# Patient Record
Sex: Female | Born: 1935 | Race: White | Hispanic: No | Smoking: Never smoker
Health system: Southern US, Community
[De-identification: ages and names within clinical notes are randomized; demographics above are authoritative.]

## PROBLEM LIST (undated history)

## (undated) DIAGNOSIS — I2510 Atherosclerotic heart disease of native coronary artery without angina pectoris: Secondary | ICD-10-CM

## (undated) DIAGNOSIS — J45909 Unspecified asthma, uncomplicated: Secondary | ICD-10-CM

## (undated) DIAGNOSIS — K219 Gastro-esophageal reflux disease without esophagitis: Secondary | ICD-10-CM

## (undated) DIAGNOSIS — E119 Type 2 diabetes mellitus without complications: Secondary | ICD-10-CM

## (undated) DIAGNOSIS — M1990 Unspecified osteoarthritis, unspecified site: Secondary | ICD-10-CM

## (undated) HISTORY — PX: APPENDECTOMY: SHX54

## (undated) HISTORY — PX: CHOLECYSTECTOMY: SHX55

## (undated) HISTORY — PX: JOINT REPLACEMENT: SHX530

## (undated) HISTORY — DX: Unspecified osteoarthritis, unspecified site: M19.90

## (undated) HISTORY — PX: ABDOMINAL HYSTERECTOMY: SHX81

## (undated) HISTORY — DX: Gastro-esophageal reflux disease without esophagitis: K21.9

## (undated) HISTORY — PX: BREAST CYST ASPIRATION: SHX578

## (undated) HISTORY — DX: Unspecified asthma, uncomplicated: J45.909

## (undated) HISTORY — DX: Atherosclerotic heart disease of native coronary artery without angina pectoris: I25.10

## (undated) HISTORY — PX: FRACTURE SURGERY: SHX138

---

## 1998-08-10 ENCOUNTER — Encounter: Admission: RE | Admit: 1998-08-10 | Discharge: 1998-11-08 | Payer: Self-pay | Admitting: *Deleted

## 2004-07-11 ENCOUNTER — Ambulatory Visit: Payer: Self-pay

## 2004-07-18 ENCOUNTER — Ambulatory Visit: Payer: Self-pay

## 2004-08-02 ENCOUNTER — Ambulatory Visit: Payer: Self-pay | Admitting: Otolaryngology

## 2005-01-10 ENCOUNTER — Ambulatory Visit: Payer: Self-pay | Admitting: Internal Medicine

## 2005-08-01 ENCOUNTER — Ambulatory Visit: Payer: Self-pay | Admitting: Ophthalmology

## 2005-08-06 ENCOUNTER — Ambulatory Visit: Payer: Self-pay | Admitting: Ophthalmology

## 2005-12-31 ENCOUNTER — Ambulatory Visit: Payer: Self-pay | Admitting: Internal Medicine

## 2006-01-14 ENCOUNTER — Ambulatory Visit: Payer: Self-pay | Admitting: Internal Medicine

## 2006-01-24 ENCOUNTER — Other Ambulatory Visit: Payer: Self-pay

## 2006-01-24 ENCOUNTER — Inpatient Hospital Stay: Payer: Self-pay | Admitting: Cardiology

## 2006-01-28 ENCOUNTER — Ambulatory Visit: Payer: Self-pay | Admitting: Ophthalmology

## 2006-07-29 ENCOUNTER — Ambulatory Visit: Payer: Self-pay | Admitting: Ophthalmology

## 2006-08-05 ENCOUNTER — Ambulatory Visit: Payer: Self-pay | Admitting: Ophthalmology

## 2006-12-16 ENCOUNTER — Ambulatory Visit: Payer: Self-pay | Admitting: Internal Medicine

## 2007-01-16 ENCOUNTER — Ambulatory Visit: Payer: Self-pay | Admitting: Internal Medicine

## 2008-01-18 ENCOUNTER — Ambulatory Visit: Payer: Self-pay | Admitting: Internal Medicine

## 2009-02-13 ENCOUNTER — Ambulatory Visit: Payer: Self-pay | Admitting: Internal Medicine

## 2010-02-15 ENCOUNTER — Ambulatory Visit: Payer: Self-pay | Admitting: Internal Medicine

## 2011-02-19 ENCOUNTER — Ambulatory Visit: Payer: Self-pay | Admitting: Internal Medicine

## 2012-02-20 ENCOUNTER — Ambulatory Visit: Payer: Self-pay

## 2013-02-23 ENCOUNTER — Ambulatory Visit: Payer: Self-pay

## 2014-06-01 ENCOUNTER — Ambulatory Visit: Payer: Self-pay | Admitting: Unknown Physician Specialty

## 2014-09-08 ENCOUNTER — Ambulatory Visit
Admit: 2014-09-08 | Disposition: A | Discharge status: Home / Self Care | Payer: Self-pay | Attending: Infectious Diseases | Admitting: Infectious Diseases

## 2015-02-27 ENCOUNTER — Encounter: Payer: Medicare Other | Attending: Internal Medicine | Admitting: Dietician

## 2015-02-27 ENCOUNTER — Encounter: Payer: Self-pay | Admitting: Dietician

## 2015-02-27 VITALS — BP 142/77 | Ht 66.0 in | Wt 141.9 lb

## 2015-02-27 DIAGNOSIS — E109 Type 1 diabetes mellitus without complications: Secondary | ICD-10-CM | POA: Diagnosis not present

## 2015-02-27 NOTE — Progress Notes (Signed)
Diabetes Self-Management Education  Visit Type: First/Initial  Appt. Start Time: 1330 Appt. End Time: 1430  02/27/2015  Ms. Lindsey Padilla, identified by name and date of birth, is a 81 y.o. female with a diagnosis of Diabetes: Type 1.   ASSESSMENT  Blood pressure 142/77, height  (1.676 m), weight 141 lb 14.4 oz (64.365 kg). Body mass index is 22.91 kg/(m^2).  Pt reports FBG today was 62-ate breakfast and took meal bolus and BG 283 (3 hr pp) and pt took correction bolus prior to coming to appt.  Pt tests BG's ac, bedtime and some 2-3  Am. Pt does not test anymore due to Medicare will only allow her to enough Strips to test 5-6x/day. Pt wears necklace with medical alert.  Pt. reports having much hip pain due to arthritis (left >right) and plans to have a hip replacement in 05-2015. Recent A1C was 8.1% and MD wants A1C <8% prior to surgery. Pt wants A1C <7.5% prior to surgery. Pt reports having no symptoms with  low BG's and MD wants her BG's to run slightly higher due to hypoglycemic unawareness.   Pt wears a Medtronic 523 insulin pump with Novolog insulin. She uses Silhouette insertion sites and changes them every 3 days (rotates sites on  thighs and abdomen). Pt counts carb grams to determine meal boluses. Her current basal rates are:  12am=0.350, 4am=0.450, 7am=0.750, 2pm=0.5, 6pm=0.475, 9:30 pm=0.3; ICR 12am=10, 5p=8; ISF 12am=50, 5pm=40, 8pm=40; target BG's 12am=120-150, 7am=110-140, 8:30pm=120-150; active insulin time 6 hrs. Pt uses Wizard feature for  boluses.       Diabetes Self-Management Education - 02/27/15 1819    Visit Information   Visit Type First/Initial   Initial Visit   Diabetes Type Type 1   Health Coping   How would you rate your overall health? Good   Psychosocial Assessment   Patient Belief/Attitude about Diabetes Motivated to manage diabetes   Self-care barriers None   Patient Concerns Healthy Lifestyle;Glycemic Control  prevent complications   Special Needs  None   Preferred Learning Style Auditory;Visual   Learning Readiness Ready   Complications   Last HgB A1C per patient/outside source 8.1 %   How often do you check your blood sugar? > 4 times/day   Fasting Blood glucose range (mg/dL) 16'X-096'E   Have you had a dilated eye exam in the past 12 months? Yes   Have you had a dental exam in the past 12 months? --  has upper and lower dentures   Are you checking your feet? Yes   How many days per week are you checking your feet? 7   Dietary Intake   Breakfast --  eats 3 meals/day   Snack (afternoon) --  eats occasional snack at 3-4p=fruit or popcorn/nuts or sugar free ice cream bar   Snack (evening) --  eats bedtime snack-brealfast bar or crackers with pnut butter   Exercise   Exercise Type --  exercise is limited due to hip pain from arthritis (pt needs left hip replacement)   Patient Education   Previous Diabetes Education Yes (please comment)   Nutrition management  Carbohydrate counting   Medications Reviewed patients medication for diabetes, action, purpose, timing of dose and side effects.;Taught/reviewed insulin injection, site rotation, insulin storage and needle disposal.  pt wears Medtronic 523 insulin pump with Novolog insulin and Silhouette insertion sites; discussed upgrading her pump which is now 5 hears old.; pt uses thighs and abdomen for pump sites   Monitoring Purpose and frequency of  SMBG.;Identified appropriate SMBG and/or A1C goals.  pt wants A1C below 7.5% prior to hip surgery which is planned for 05-2015; MD noted wants A1C <8%   Acute complications Taught treatment of hypoglycemia - the 15 rule.  pt reports no longer having symptoms with low BG's   Reviewed use of Wizard pump feature for bolusing. Reviewed changing reservoir and pump insertion sites. Explained meaning of active insulin time. Pt requested to lower active time-states "it seems like it stays active for too long". Instructed pt on making changes in Wizard  setup and pt changed active insulin times to 5 hours. Also pt made changes to BG target as follows:  12am=120-140, 7am=110-130; 8:30pm=120-140. Instructed pt on testing basal rates-to start with night time first. Pt has had pump for 5 years and pump is out of warranty. Encouraged pt to consider upgrading pump.     Individualized Plan for Diabetes Self-Management Training:   Learning Objective:  Patient will have a greater understanding of diabetes self-management. Patient education plan is to attend individual and/or group sessions per assessed needs and concerns.   Plan:   Patient Instructions    Check blood sugars 4 x day before each meal and before bed every day, at 2-3 AM and some 2 hr pp  Complete 3 Day Food Record and bring to next appt-estimate carb grams  Bring blood sugar records to the next appointment/class  Call your doctor for  prescription for test strips to test 8x/day  Carry fast acting glucose and a snack at all times  Rotate injection sites  Test night time basal rates when BG is 120 or above at bedtime-DO NOT eat a bedtime snack when testing  Call insurance to check on pump upgrade  Call [employer] [facility] if questions or problems arise-[phone]  Return for F/U appointment on:  03-15-15 with dietitian  Expected Outcomes:     Education material provided: instructions for testing basal rates on pump  If problems or questions, patient to contact team via: [phone]  Future DSME appointment:  03-15-15

## 2015-02-27 NOTE — Patient Instructions (Signed)
   Check blood sugars 4 x day before each meal and before bed every day, at 2-3 AM and some 2 hr pp  Complete 3 Day Food Record and bring to next appt-estimate carb grams  Bring blood sugar records to the next appointment/class  Call your doctor for  prescription for test strips to test 8x/day  Carry fast acting glucose and a snack at all times  Rotate injection sites  Test night time basal rates when BG is 120 or above at bedtime-DO NOT eat a bedtime snack when testing  Call insurance to check on pump upgrade  Call [facility] if questions or problems arise-[phone]  Return for F/U appointment on:  03-15-15

## 2015-03-06 ENCOUNTER — Telehealth: Payer: Self-pay | Admitting: Dietician

## 2015-03-06 NOTE — Telephone Encounter (Signed)
Called pt for update on BG's-pt reports BG's overall are better. Today's FBG 126 and 2 hr pp BG 123. Had FBG 200x1 and low BGx1 50 at night. Pt has changed ICR at supper to 1:10 to prevent low BG's. Unable to check night time basal rates due to bedtime BG's 124, 119, 115. Usually eats bedtime snack and boluses. Advised pt to only bolus for bedtime snack if BG >120. Pt reports she fell on 05-01-15 and has 2 black eyes as result of hitting head-was evaluated in ER. Asked pt to test night time basal rates when able.

## 2015-03-15 ENCOUNTER — Encounter: Payer: Medicare Other | Admitting: Dietician

## 2015-03-15 VITALS — Ht 66.0 in | Wt 142.9 lb

## 2015-03-15 DIAGNOSIS — E109 Type 1 diabetes mellitus without complications: Secondary | ICD-10-CM | POA: Diagnosis not present

## 2015-03-15 NOTE — Progress Notes (Signed)
Diabetes Self-Management Education  Visit Type:  Follow-up  Appt. Start Time: 1030 Appt. End Time: 1130  03/15/2015  Ms. Lindsey Padilla, identified by name and date of birth, is a 81 y.o. female with a diagnosis of Diabetes:  .   ASSESSMENT  Height 5\' 6"  (1.676 m), weight 142 lb 14.4 oz (64.819 kg). Body mass index is 23.08 kg/(m^2).       Diabetes Self-Management Education - 03/15/15 1050    Complications   Fasting Blood glucose range (mg/dL) 409-811;[id number];180-200   Postprandial Blood glucose range (mg/dL) 95-621;[id number];130-179   Have you had a dilated eye exam in the past 12 months? Yes   Have you had a dental exam in the past 12 months? No   Are you checking your feet? Yes   How many days per week are you checking your feet? 7   Exercise   Exercise Type ADL's  uses cane, hip replacement pending in January   Patient Education   Nutrition management  Carbohydrate counting;Other (comment)  importance of protein sources with all meals   Monitoring Taught/discussed recording of test results and interpretation of SMBG.      Learning Objective:  Patient will have a greater understanding of diabetes self-management. Patient education plan is to have RN review BGs and pump settings, communicate with patient by phone, and schedule additional RN or RD visits as needed.   Plan:   Patient Instructions   2400mg  potassium daily is a low postassium diet. When you check labels, aim for 600mg  each meal average.   Make sure to eat a protein food with every meal. Can add an egg or 1/4 cup nuts or 1oz lowfat cheese to your breakfast. If you have a vegetable meal, make one of your vegetables some beans.   Keep carbohydrate intake to 30-45g with each meal.  You can subtract fiber grams, and 1/2 of any sugar alcohols in the food from the total carbohydrate for an accurate carb count.     Expected Outcomes:     Education material provided: Carbohydrate counting (Apidra); Carb Counting and Meal  Planning (Novo)  If problems or questions, patient to contact team via:  Phone  Future DSME appointment: -  To schedule with RN.

## 2015-03-15 NOTE — Patient Instructions (Addendum)
   2400mg  potassium daily is a low postassium diet. When you check labels, aim for 600mg  each meal average.   Make sure to eat a protein food with every meal. Can add an egg or 1/4 cup nuts or 1oz lowfat cheese to your breakfast. If you have a vegetable meal, make one of your vegetables some beans.   Keep carbohydrate intake to 30-45g with each meal.  You can subtract fiber grams, and 1/2 of any sugar alcohols in the food from the total carbohydrate for an accurate carb count.

## 2015-03-21 ENCOUNTER — Telehealth: Payer: Self-pay | Admitting: Dietician

## 2015-03-21 NOTE — Telephone Encounter (Signed)
Called pt who reports saw Dr. Rosalita ChessmanSpratt on 03-13-15=A1C 7.7%-MD decreased 7am basal rate to 0.725 due to recent ac lunch BG's mostly 90's. Pt reports she is getting a new insulin pump. FBG's 90-163;  BG's pp breakfast mostly 200's since basal change was made; ac lunch 87-253; pp lunch 94-193; ac supper 102-135 (57x1); pp supper 65-224; bedtime 122-184; 3AM 91-249. Pt boluses for meals using Wizard feature.  When BG ac is low-pt corrects low BG and may not always be bolusing for meal. Advised pt to correct low BG and use Wizard for meal bolus. Pt changed 12AM  ICR to 9, 11am=10;  asked pt to test night time basal rate when bedtime BG's between 120-150-do NOT eat or take bolus and then check 3am BG and FBG. Pt to call me 03-27-15 with update. Will schedule FU appt for training on new pump when arrives. Will consider CGM if needed.

## 2015-03-27 ENCOUNTER — Telehealth: Payer: Self-pay | Admitting: Dietician

## 2015-03-27 NOTE — Telephone Encounter (Signed)
Called pt for update on BG's-no answer-left message on answering machine for pt to call me

## 2015-04-03 ENCOUNTER — Telehealth: Payer: Self-pay | Admitting: Dietician

## 2015-04-03 NOTE — Telephone Encounter (Signed)
Called pt-pt reports she will not pursue getting a new insulin pump at this time-plans to wait until after her hip surgery in early 2017

## 2015-04-03 NOTE — Telephone Encounter (Signed)
Pt called with BG's-BG's initially improving after 03-22-15 with changes made in ICR 12am 8 - pt is checking BG's 7-8+x/day; Pt went on beach trip 03-27-15 through 03-28-15 and had some 200's then after back home developed sore throat and cold. Since 03-29-15,  FBG's 118-81, 2 hr pp breakfast 192-234, ac lunch 137-277, 2 hr pp lunch 186-212, ac supper 75-180, 2 hr pp supper 135-207, bedtime 96-338, BG's during night 78-369. Sore throat/cold is much improved now. Pt wants to change ICR -assisted over phone to change ICR 12am to 8; 11am ICR was already set to 8. Pt to call me in 3-4 days with BG's or sooner if has low BG's.

## 2015-04-10 ENCOUNTER — Telehealth: Payer: Self-pay | Admitting: Dietician

## 2015-04-10 NOTE — Telephone Encounter (Signed)
Called pt for update on BG's-pt having elevated 3am BG's (193-325; today 143); FBG's 106-329; BG's pp  breakfast 86-217; ac lunch 98, 70, 78, 90, 58, 153, 171; BG's pp lunch 66-186; ac supper 50-184; 2 hr pp supper 108-186; bedtime 113, 140, 252, 57/137, 144, 99, 131. Reviewed pump settings and pt made following changes: basal rates increased 12am=0.375, 4am=0.450, 7am=0.725, 2pm=0.5, 6pm=0.475, increased 10:30pm=0.325; ICR 12am=8, 11am=9, 5pm=8. Asked pt to call me in 1 week with BG's or sooner if problems arise

## 2015-04-17 ENCOUNTER — Telehealth: Payer: Self-pay | Admitting: Dietician

## 2015-04-17 NOTE — Telephone Encounter (Signed)
Called pt for update on BG's-pt reports BG's slightly improved-7 day average 151, 14 day average 147, 30 day average 160-has occasional 60's-80's after lunch and has occasional 200's especially after treating a low BG. Discussed proper treatment for low BG's-advised if BG 60's-70's only take 1-2 glucose tablets and eat only  10 carb gram snack. If severe low BG treat with 3-4 glucose tabs. and a snack. Still having mostly elevated 3am BG's-131, 298, 163, 203, 166, 250.  Pt increased basal rate at 12 am to 0.400. Will follow up in 1 week or sooner if problems.

## 2015-04-24 ENCOUNTER — Telehealth: Payer: Self-pay | Admitting: Dietician

## 2015-04-24 NOTE — Telephone Encounter (Signed)
Pt called on 04-22-15 with concerns about low BG's-had episode with low BG last night and "couldn't think" -BG's were 124, 102, 71, 71, 58, 75 after supper. Reviewed BG log.   Pt was treating low BG with only 1 glucose tablet and a snack and was unable to get BG to come up.  Reviewed proper treatment of low BG's. pp BG's after supper at times have been 110's- pt changed ICR at 5pm to 9 and lowered 6pm basal rate to 0.450. Called pt today for update of BG's. BG's are improved with no more low BG's past 2 days. Occasional elevated pp BG likely due to diet. BG's ac supper past 2 days have been 90's-pt lowered basal rate at 2pm to 0.475. To call me if problems or in 1 week.

## 2015-05-02 ENCOUNTER — Telehealth: Payer: Self-pay | Admitting: Dietician

## 2015-05-02 NOTE — Telephone Encounter (Signed)
Called pt for update on BG's-Pt reports had preop labs on 05-01-15. Hip surgery planned for 05-29-15. Has appointment with Dr Rosalita ChessmanSpratt on 05-23-15. Overall BG's doing somewhat better-no severe low BG's.  3am BG's= 170, 190, 137, 172, 177, 214, 169, 260; FBG 137, 123, 132, 101, 115, 92, 186, 171; 2 hr pp breakfast 189, 78, 193, 80, 102, 241, 76, 202; ac lunch 99, 77, 70, 107, 87, 162, 102, 120; 2 hr pp lunch 102, 185, 123, 79, 136, 84; ac supper 99, 168, 79, 96, 76, 98, 109; 2 hr pp supper 178, 156, 121, 92, 130, 116; bedtime 179, 259, 68, 123, 120, 207, 213. Pt changed 12am basal rate to 0.425 and 2pm basal rate to 0.450. Will FU with pt in 1 wk.

## 2015-05-08 ENCOUNTER — Telehealth: Payer: Self-pay | Admitting: Dietician

## 2015-05-08 NOTE — Telephone Encounter (Signed)
Called pt for update on BG's-3AM BG's vary 114-266; FBG's 133, 123, 133, 157, 173, 73; 2 hr pp breakfast  173, 187, 170, 173, 227, 98; ac lunch 137, 104, 112, 120, 96, 99; 2 hr pp lunch 86, 123, 95, 162, 311 (ate out and unsure if took bolus); ac supper 115, 65, 87, 72, 103, 72; 2 hr pp supper 257, 252, 178, 97, 149, 127; bedtime 154, 152, 179, 183, 102, 146. Pt decreased 2PM basal rate to 0.425. Pt reports hip surgery is set for 05-29-15 and had A1C last wk for preop and results were 7.4%. Will FU with pt in 1 wk.

## 2015-05-16 ENCOUNTER — Telehealth: Payer: Self-pay | Admitting: Dietician

## 2015-05-16 NOTE — Telephone Encounter (Signed)
Called pt-no answer-left message for pt to call me with update on BG's  

## 2015-05-21 HISTORY — PX: JOINT REPLACEMENT: SHX530

## 2015-05-23 ENCOUNTER — Encounter: Payer: Self-pay | Admitting: Dietician

## 2015-05-23 NOTE — Progress Notes (Signed)
Overall improvement in BG's . Recent A1C 7.4% per pt. Faxed update with BG's to Dr. Rosalita ChessmanSpratt. Pt does not wish to return since Medicare only covers 2 hours of education per year. Pt has appt today with Dr. Rosalita ChessmanSpratt at 3pm. Plans hip surgery on 05-29-15.

## 2015-06-01 ENCOUNTER — Encounter
Admission: RE | Admit: 2015-06-01 | Discharge: 2015-06-01 | Disposition: A | Discharge status: Home / Self Care | Payer: Medicare Other | Source: Ambulatory Visit | Attending: Internal Medicine | Admitting: Internal Medicine

## 2015-06-01 DIAGNOSIS — E119 Type 2 diabetes mellitus without complications: Secondary | ICD-10-CM | POA: Insufficient documentation

## 2015-06-01 DIAGNOSIS — Z794 Long term (current) use of insulin: Secondary | ICD-10-CM | POA: Insufficient documentation

## 2015-06-01 DIAGNOSIS — R410 Disorientation, unspecified: Secondary | ICD-10-CM | POA: Insufficient documentation

## 2015-06-02 DIAGNOSIS — Z794 Long term (current) use of insulin: Secondary | ICD-10-CM | POA: Diagnosis not present

## 2015-06-02 DIAGNOSIS — E119 Type 2 diabetes mellitus without complications: Secondary | ICD-10-CM | POA: Diagnosis present

## 2015-06-02 DIAGNOSIS — R410 Disorientation, unspecified: Secondary | ICD-10-CM | POA: Diagnosis present

## 2015-06-02 LAB — GLUCOSE, CAPILLARY
GLUCOSE-CAPILLARY: 321 mg/dL — AB (ref 65–99)
Glucose-Capillary: 262 mg/dL — ABNORMAL HIGH (ref 65–99)
Glucose-Capillary: 248 mg/dL — ABNORMAL HIGH (ref 65–99)

## 2015-06-03 DIAGNOSIS — R410 Disorientation, unspecified: Secondary | ICD-10-CM | POA: Diagnosis not present

## 2015-06-03 LAB — GLUCOSE, CAPILLARY
Glucose-Capillary: 281 mg/dL — ABNORMAL HIGH (ref 65–99)
Glucose-Capillary: 278 mg/dL — ABNORMAL HIGH (ref 65–99)
Glucose-Capillary: 284 mg/dL — ABNORMAL HIGH (ref 65–99)

## 2015-06-04 LAB — GLUCOSE, CAPILLARY
GLUCOSE-CAPILLARY: 206 mg/dL — AB (ref 65–99)
GLUCOSE-CAPILLARY: 173 mg/dL — AB (ref 65–99)
GLUCOSE-CAPILLARY: 238 mg/dL — AB (ref 65–99)
GLUCOSE-CAPILLARY: 186 mg/dL — AB (ref 65–99)
GLUCOSE-CAPILLARY: 214 mg/dL — AB (ref 65–99)
Glucose-Capillary: 209 mg/dL — ABNORMAL HIGH (ref 65–99)
Glucose-Capillary: 277 mg/dL — ABNORMAL HIGH (ref 65–99)
Glucose-Capillary: 222 mg/dL — ABNORMAL HIGH (ref 65–99)

## 2015-06-05 DIAGNOSIS — R410 Disorientation, unspecified: Secondary | ICD-10-CM | POA: Diagnosis not present

## 2015-06-05 LAB — GLUCOSE, CAPILLARY: GLUCOSE-CAPILLARY: 326 mg/dL — AB (ref 65–99)

## 2015-06-06 DIAGNOSIS — R410 Disorientation, unspecified: Secondary | ICD-10-CM | POA: Diagnosis not present

## 2015-06-06 LAB — GLUCOSE, CAPILLARY
GLUCOSE-CAPILLARY: 296 mg/dL — AB (ref 65–99)
GLUCOSE-CAPILLARY: 231 mg/dL — AB (ref 65–99)
GLUCOSE-CAPILLARY: 271 mg/dL — AB (ref 65–99)
GLUCOSE-CAPILLARY: 192 mg/dL — AB (ref 65–99)
GLUCOSE-CAPILLARY: 285 mg/dL — AB (ref 65–99)
Glucose-Capillary: 269 mg/dL — ABNORMAL HIGH (ref 65–99)
Glucose-Capillary: 280 mg/dL — ABNORMAL HIGH (ref 65–99)
Glucose-Capillary: 101 mg/dL — ABNORMAL HIGH (ref 65–99)
Glucose-Capillary: 127 mg/dL — ABNORMAL HIGH (ref 65–99)

## 2015-06-06 LAB — URINALYSIS COMPLETE WITH MICROSCOPIC (ARMC ONLY)
BILIRUBIN URINE: NEGATIVE
Bacteria, UA: NONE SEEN
GLUCOSE, UA: 50 mg/dL — AB
HGB URINE DIPSTICK: NEGATIVE
KETONES UR: NEGATIVE mg/dL
LEUKOCYTES UA: NEGATIVE
NITRITE: NEGATIVE
Protein, ur: NEGATIVE mg/dL
RBC / HPF: NONE SEEN RBC/hpf (ref 0–5)
SPECIFIC GRAVITY, URINE: 1.01 (ref 1.005–1.030)
pH: 6 (ref 5.0–8.0)

## 2015-06-07 DIAGNOSIS — R410 Disorientation, unspecified: Secondary | ICD-10-CM | POA: Diagnosis not present

## 2015-06-07 LAB — GLUCOSE, CAPILLARY
GLUCOSE-CAPILLARY: 129 mg/dL — AB (ref 65–99)
GLUCOSE-CAPILLARY: 230 mg/dL — AB (ref 65–99)
GLUCOSE-CAPILLARY: 268 mg/dL — AB (ref 65–99)
Glucose-Capillary: 120 mg/dL — ABNORMAL HIGH (ref 65–99)
Glucose-Capillary: 118 mg/dL — ABNORMAL HIGH (ref 65–99)

## 2015-06-08 DIAGNOSIS — R410 Disorientation, unspecified: Secondary | ICD-10-CM | POA: Diagnosis not present

## 2015-06-08 LAB — URINE CULTURE: Culture: 20000

## 2015-06-08 LAB — GLUCOSE, CAPILLARY: Glucose-Capillary: 146 mg/dL — ABNORMAL HIGH (ref 65–99)

## 2015-06-09 DIAGNOSIS — R410 Disorientation, unspecified: Secondary | ICD-10-CM | POA: Diagnosis not present

## 2015-06-09 LAB — GLUCOSE, CAPILLARY
GLUCOSE-CAPILLARY: 239 mg/dL — AB (ref 65–99)
GLUCOSE-CAPILLARY: 118 mg/dL — AB (ref 65–99)
GLUCOSE-CAPILLARY: 122 mg/dL — AB (ref 65–99)
GLUCOSE-CAPILLARY: 181 mg/dL — AB (ref 65–99)
GLUCOSE-CAPILLARY: 220 mg/dL — AB (ref 65–99)
GLUCOSE-CAPILLARY: 164 mg/dL — AB (ref 65–99)
Glucose-Capillary: 216 mg/dL — ABNORMAL HIGH (ref 65–99)
Glucose-Capillary: 215 mg/dL — ABNORMAL HIGH (ref 65–99)
Glucose-Capillary: 218 mg/dL — ABNORMAL HIGH (ref 65–99)

## 2015-06-10 DIAGNOSIS — R410 Disorientation, unspecified: Secondary | ICD-10-CM | POA: Diagnosis not present

## 2015-06-10 LAB — GLUCOSE, CAPILLARY
GLUCOSE-CAPILLARY: 142 mg/dL — AB (ref 65–99)
GLUCOSE-CAPILLARY: 223 mg/dL — AB (ref 65–99)
Glucose-Capillary: 173 mg/dL — ABNORMAL HIGH (ref 65–99)

## 2015-06-11 LAB — GLUCOSE, CAPILLARY
GLUCOSE-CAPILLARY: 176 mg/dL — AB (ref 65–99)
Glucose-Capillary: 177 mg/dL — ABNORMAL HIGH (ref 65–99)
Glucose-Capillary: 198 mg/dL — ABNORMAL HIGH (ref 65–99)

## 2015-06-12 DIAGNOSIS — R410 Disorientation, unspecified: Secondary | ICD-10-CM | POA: Diagnosis not present

## 2015-06-12 LAB — GLUCOSE, CAPILLARY
GLUCOSE-CAPILLARY: 236 mg/dL — AB (ref 65–99)
GLUCOSE-CAPILLARY: 233 mg/dL — AB (ref 65–99)
Glucose-Capillary: 158 mg/dL — ABNORMAL HIGH (ref 65–99)
Glucose-Capillary: 222 mg/dL — ABNORMAL HIGH (ref 65–99)
Glucose-Capillary: 215 mg/dL — ABNORMAL HIGH (ref 65–99)

## 2015-06-13 DIAGNOSIS — R410 Disorientation, unspecified: Secondary | ICD-10-CM | POA: Diagnosis not present

## 2015-06-13 LAB — GLUCOSE, CAPILLARY
GLUCOSE-CAPILLARY: 249 mg/dL — AB (ref 65–99)
Glucose-Capillary: 201 mg/dL — ABNORMAL HIGH (ref 65–99)
Glucose-Capillary: 205 mg/dL — ABNORMAL HIGH (ref 65–99)
Glucose-Capillary: 177 mg/dL — ABNORMAL HIGH (ref 65–99)
Glucose-Capillary: 180 mg/dL — ABNORMAL HIGH (ref 65–99)

## 2015-06-14 LAB — GLUCOSE, CAPILLARY
GLUCOSE-CAPILLARY: 175 mg/dL — AB (ref 65–99)
Glucose-Capillary: 238 mg/dL — ABNORMAL HIGH (ref 65–99)
Glucose-Capillary: 184 mg/dL — ABNORMAL HIGH (ref 65–99)
Glucose-Capillary: 189 mg/dL — ABNORMAL HIGH (ref 65–99)
Glucose-Capillary: 215 mg/dL — ABNORMAL HIGH (ref 65–99)
Glucose-Capillary: 190 mg/dL — ABNORMAL HIGH (ref 65–99)
Glucose-Capillary: 83 mg/dL (ref 65–99)

## 2015-06-15 DIAGNOSIS — R410 Disorientation, unspecified: Secondary | ICD-10-CM | POA: Diagnosis not present

## 2015-06-15 LAB — GLUCOSE, CAPILLARY
GLUCOSE-CAPILLARY: 89 mg/dL (ref 65–99)
GLUCOSE-CAPILLARY: 120 mg/dL — AB (ref 65–99)
GLUCOSE-CAPILLARY: 119 mg/dL — AB (ref 65–99)
GLUCOSE-CAPILLARY: 178 mg/dL — AB (ref 65–99)
Glucose-Capillary: 238 mg/dL — ABNORMAL HIGH (ref 65–99)
Glucose-Capillary: 170 mg/dL — ABNORMAL HIGH (ref 65–99)

## 2015-06-16 DIAGNOSIS — R410 Disorientation, unspecified: Secondary | ICD-10-CM | POA: Diagnosis not present

## 2015-06-16 LAB — GLUCOSE, CAPILLARY
GLUCOSE-CAPILLARY: 181 mg/dL — AB (ref 65–99)
GLUCOSE-CAPILLARY: 211 mg/dL — AB (ref 65–99)
Glucose-Capillary: 272 mg/dL — ABNORMAL HIGH (ref 65–99)
Glucose-Capillary: 156 mg/dL — ABNORMAL HIGH (ref 65–99)
Glucose-Capillary: 118 mg/dL — ABNORMAL HIGH (ref 65–99)
Glucose-Capillary: 176 mg/dL — ABNORMAL HIGH (ref 65–99)

## 2015-06-17 DIAGNOSIS — R410 Disorientation, unspecified: Secondary | ICD-10-CM | POA: Diagnosis not present

## 2015-06-17 LAB — GLUCOSE, CAPILLARY
GLUCOSE-CAPILLARY: 155 mg/dL — AB (ref 65–99)
GLUCOSE-CAPILLARY: 207 mg/dL — AB (ref 65–99)
Glucose-Capillary: 191 mg/dL — ABNORMAL HIGH (ref 65–99)
Glucose-Capillary: 201 mg/dL — ABNORMAL HIGH (ref 65–99)
Glucose-Capillary: 312 mg/dL — ABNORMAL HIGH (ref 65–99)

## 2015-06-18 DIAGNOSIS — R410 Disorientation, unspecified: Secondary | ICD-10-CM | POA: Diagnosis not present

## 2015-06-18 LAB — GLUCOSE, CAPILLARY
Glucose-Capillary: 218 mg/dL — ABNORMAL HIGH (ref 65–99)
Glucose-Capillary: 234 mg/dL — ABNORMAL HIGH (ref 65–99)
Glucose-Capillary: 166 mg/dL — ABNORMAL HIGH (ref 65–99)

## 2015-06-19 DIAGNOSIS — R410 Disorientation, unspecified: Secondary | ICD-10-CM | POA: Diagnosis not present

## 2015-06-19 LAB — GLUCOSE, CAPILLARY
GLUCOSE-CAPILLARY: 184 mg/dL — AB (ref 65–99)
GLUCOSE-CAPILLARY: 94 mg/dL (ref 65–99)
GLUCOSE-CAPILLARY: 113 mg/dL — AB (ref 65–99)
GLUCOSE-CAPILLARY: 176 mg/dL — AB (ref 65–99)
Glucose-Capillary: 95 mg/dL (ref 65–99)
Glucose-Capillary: 75 mg/dL (ref 65–99)
Glucose-Capillary: 222 mg/dL — ABNORMAL HIGH (ref 65–99)

## 2015-06-20 DIAGNOSIS — R410 Disorientation, unspecified: Secondary | ICD-10-CM | POA: Diagnosis not present

## 2015-06-20 LAB — GLUCOSE, CAPILLARY
GLUCOSE-CAPILLARY: 292 mg/dL — AB (ref 65–99)
GLUCOSE-CAPILLARY: 165 mg/dL — AB (ref 65–99)
GLUCOSE-CAPILLARY: 135 mg/dL — AB (ref 65–99)
GLUCOSE-CAPILLARY: 117 mg/dL — AB (ref 65–99)
Glucose-Capillary: 78 mg/dL (ref 65–99)

## 2015-06-21 ENCOUNTER — Encounter
Admission: RE | Admit: 2015-06-21 | Discharge: 2015-06-21 | Disposition: A | Discharge status: Home / Self Care | Payer: Medicare Other | Source: Ambulatory Visit | Attending: Internal Medicine | Admitting: Internal Medicine

## 2015-06-21 DIAGNOSIS — E119 Type 2 diabetes mellitus without complications: Secondary | ICD-10-CM | POA: Insufficient documentation

## 2015-06-21 LAB — GLUCOSE, CAPILLARY
GLUCOSE-CAPILLARY: 393 mg/dL — AB (ref 65–99)
GLUCOSE-CAPILLARY: 338 mg/dL — AB (ref 65–99)
Glucose-Capillary: 411 mg/dL — ABNORMAL HIGH (ref 65–99)

## 2015-06-23 ENCOUNTER — Encounter
Admission: RE | Admit: 2015-06-23 | Discharge: 2015-06-23 | Disposition: A | Discharge status: Home / Self Care | Payer: Medicare Other | Source: Ambulatory Visit | Attending: Internal Medicine | Admitting: Internal Medicine

## 2015-09-04 ENCOUNTER — Ambulatory Visit: Payer: Medicare Other | Admitting: Dietician

## 2015-09-05 ENCOUNTER — Encounter: Payer: Self-pay | Admitting: Dietician

## 2015-09-05 ENCOUNTER — Encounter: Payer: Medicare Other | Attending: Internal Medicine | Admitting: Dietician

## 2015-09-05 NOTE — Patient Instructions (Signed)
Use booklet Using the Minimed 630G Insulin Pump for assistance when starting a new reservoir/set and bolusing  Call if questions arise [phone]

## 2015-09-05 NOTE — Progress Notes (Signed)
Pump Upgrade Training: Appt start time: 1300 end time: 1500  Assessment: Primary concerns today: Patient here for upgrade from 523 Minimed pump to Minimed 630G pump  insulin pump. Patient  has not completed training modules from [employer] but did review Using the Minimed 630G Insulin Pump for Experienced Pump Users booklet   Medications: see list. Insulin used in pump is Humalog  Intervention:  Pump settings transferred directly from current pump to new pump by patient New meter ID added to pump for communicating Reviewed new features of pump with patient Patient  is not signed up on [facility] Web Site Patient started new 630G insulin pump with Silhouette insertion set to right abdomen Practiced giving boluses via 630G pump Patient expressed understanding of info taught and is wearing pump by end of visit Instructed on use of [place] insertion set per patient's request   Follow Up   Patient to see MD for insulin dose adjustments  To call if questions arise [phone]

## 2016-05-30 ENCOUNTER — Emergency Department: Payer: Medicare Other

## 2016-05-30 ENCOUNTER — Emergency Department
Admission: EM | Admit: 2016-05-30 | Discharge: 2016-05-30 | Disposition: A | Discharge status: Other Acute Inpatient Hospital | Payer: Medicare Other | Attending: Emergency Medicine | Admitting: Emergency Medicine

## 2016-05-30 ENCOUNTER — Encounter: Payer: Self-pay | Admitting: *Deleted

## 2016-05-30 DIAGNOSIS — S7221XA Displaced subtrochanteric fracture of right femur, initial encounter for closed fracture: Secondary | ICD-10-CM | POA: Insufficient documentation

## 2016-05-30 DIAGNOSIS — Y939 Activity, unspecified: Secondary | ICD-10-CM | POA: Insufficient documentation

## 2016-05-30 DIAGNOSIS — S79911A Unspecified injury of right hip, initial encounter: Secondary | ICD-10-CM | POA: Diagnosis present

## 2016-05-30 DIAGNOSIS — J45909 Unspecified asthma, uncomplicated: Secondary | ICD-10-CM | POA: Diagnosis not present

## 2016-05-30 DIAGNOSIS — Y929 Unspecified place or not applicable: Secondary | ICD-10-CM | POA: Diagnosis not present

## 2016-05-30 DIAGNOSIS — Y999 Unspecified external cause status: Secondary | ICD-10-CM | POA: Diagnosis not present

## 2016-05-30 DIAGNOSIS — W109XXA Fall (on) (from) unspecified stairs and steps, initial encounter: Secondary | ICD-10-CM | POA: Diagnosis not present

## 2016-05-30 DIAGNOSIS — S72001A Fracture of unspecified part of neck of right femur, initial encounter for closed fracture: Secondary | ICD-10-CM

## 2016-05-30 LAB — URINALYSIS, COMPLETE (UACMP) WITH MICROSCOPIC
BACTERIA UA: NONE SEEN
Bilirubin Urine: NEGATIVE
Glucose, UA: NEGATIVE mg/dL
HGB URINE DIPSTICK: NEGATIVE
Ketones, ur: 5 mg/dL — AB
LEUKOCYTES UA: NEGATIVE
NITRITE: NEGATIVE
PH: 5 (ref 5.0–8.0)
Protein, ur: NEGATIVE mg/dL
SPECIFIC GRAVITY, URINE: 1.014 (ref 1.005–1.030)

## 2016-05-30 LAB — COMPREHENSIVE METABOLIC PANEL
ALT: 23 U/L (ref 14–54)
AST: 32 U/L (ref 15–41)
Albumin: 3.8 g/dL (ref 3.5–5.0)
Alkaline Phosphatase: 54 U/L (ref 38–126)
Anion gap: 8 (ref 5–15)
BUN: 27 mg/dL — ABNORMAL HIGH (ref 6–20)
CHLORIDE: 103 mmol/L (ref 101–111)
CO2: 24 mmol/L (ref 22–32)
CREATININE: 0.91 mg/dL (ref 0.44–1.00)
Calcium: 9.4 mg/dL (ref 8.9–10.3)
GFR, EST NON AFRICAN AMERICAN: 57 mL/min — AB (ref >60)
Glucose, Bld: 156 mg/dL — ABNORMAL HIGH (ref 65–99)
POTASSIUM: 4.7 mmol/L (ref 3.5–5.1)
SODIUM: 135 mmol/L (ref 135–145)
Total Bilirubin: 0.4 mg/dL (ref 0.3–1.2)
Total Protein: 6.3 g/dL — ABNORMAL LOW (ref 6.5–8.1)

## 2016-05-30 LAB — CBC WITH DIFFERENTIAL/PLATELET
Basophils Absolute: 0 10*3/uL (ref 0–0.1)
Basophils Relative: 1 %
EOS ABS: 0.2 10*3/uL (ref 0–0.7)
Eosinophils Relative: 3 %
HEMATOCRIT: 33.4 % — AB (ref 35.0–47.0)
HEMOGLOBIN: 11.1 g/dL — AB (ref 12.0–16.0)
LYMPHS ABS: 1.7 10*3/uL (ref 1.0–3.6)
Lymphocytes Relative: 24 %
MCH: 30.9 pg (ref 26.0–34.0)
MCHC: 33.2 g/dL (ref 32.0–36.0)
MCV: 93 fL (ref 80.0–100.0)
MONOS PCT: 9 %
Monocytes Absolute: 0.7 10*3/uL (ref 0.2–0.9)
NEUTROS PCT: 63 %
Neutro Abs: 4.6 10*3/uL (ref 1.4–6.5)
Platelets: 221 10*3/uL (ref 150–440)
RBC: 3.59 MIL/uL — AB (ref 3.80–5.20)
RDW: 13 % (ref 11.5–14.5)
WBC: 7.3 10*3/uL (ref 3.6–11.0)

## 2016-05-30 LAB — TYPE AND SCREEN
ABO/RH(D): A POS
ANTIBODY SCREEN: NEGATIVE

## 2016-05-30 LAB — GLUCOSE, CAPILLARY
GLUCOSE-CAPILLARY: 142 mg/dL — AB (ref 65–99)
GLUCOSE-CAPILLARY: 129 mg/dL — AB (ref 65–99)
GLUCOSE-CAPILLARY: 101 mg/dL — AB (ref 65–99)
Glucose-Capillary: 114 mg/dL — ABNORMAL HIGH (ref 65–99)

## 2016-05-30 LAB — PROTIME-INR
INR: 0.97
PROTHROMBIN TIME: 12.9 s (ref 11.4–15.2)

## 2016-05-30 MED ORDER — MORPHINE SULFATE (PF) 4 MG/ML IV SOLN
4.0000 mg | Freq: Once | INTRAVENOUS | Status: AC
  Administered 2016-05-30: 4 mg via INTRAVENOUS
  Filled 2016-05-30: qty 1

## 2016-05-30 MED ORDER — MORPHINE SULFATE (PF) 2 MG/ML IV SOLN
INTRAVENOUS | Status: AC
  Administered 2016-05-30: 2 mg via INTRAVENOUS
  Filled 2016-05-30: qty 1

## 2016-05-30 MED ORDER — ONDANSETRON HCL 4 MG/2ML IJ SOLN
4.0000 mg | Freq: Once | INTRAMUSCULAR | Status: AC
  Administered 2016-05-30: 4 mg via INTRAVENOUS

## 2016-05-30 MED ORDER — MORPHINE SULFATE (PF) 4 MG/ML IV SOLN
4.0000 mg | Freq: Once | INTRAVENOUS | Status: AC
  Administered 2016-05-30: 4 mg via INTRAVENOUS

## 2016-05-30 MED ORDER — MORPHINE SULFATE (PF) 4 MG/ML IV SOLN
INTRAVENOUS | Status: AC
  Filled 2016-05-30: qty 1

## 2016-05-30 MED ORDER — MORPHINE SULFATE (PF) 2 MG/ML IV SOLN
2.0000 mg | Freq: Once | INTRAVENOUS | Status: AC
  Administered 2016-05-30: 2 mg via INTRAVENOUS

## 2016-05-30 MED ORDER — SODIUM CHLORIDE 0.9 % IV SOLN
Freq: Once | INTRAVENOUS | Status: AC
  Administered 2016-05-30: 15:00:00 via INTRAVENOUS

## 2016-05-30 MED ORDER — MORPHINE SULFATE (PF) 4 MG/ML IV SOLN
6.0000 mg | Freq: Once | INTRAVENOUS | Status: DC
  Filled 2016-05-30: qty 2

## 2016-05-30 MED ORDER — DEXTROSE 5 % IV SOLN
Freq: Once | INTRAVENOUS | Status: AC
  Administered 2016-05-30: 15:00:00 via INTRAVENOUS

## 2016-05-30 MED ORDER — ONDANSETRON HCL 4 MG/2ML IJ SOLN
INTRAMUSCULAR | Status: AC
  Administered 2016-05-30: 4 mg via INTRAVENOUS
  Filled 2016-05-30: qty 2

## 2016-05-30 NOTE — ED Triage Notes (Signed)
Pt tripped going down her steps, no LOC, denies hitting head, pt is on Plavix, pt is alert and oriented, pt has an insulin pump , current blood sugar 197, pt complains of right hip pain,pt unable to straighten leg

## 2016-05-30 NOTE — ED Provider Notes (Signed)
[facility] Emergency Department Provider Note   ____________________________________________   First MD Initiated Contact with Patient 05/30/16 1116     (approximate)  I have reviewed the triage vital signs and the nursing notes.   HISTORY  Chief Complaint Fall    HPI Lindsey Padilla is a 83 y.o. female patient reports she tripped up the stairs and fell hitting her right hip. She denies hitting her head she denies any other injuries she did not pass out. The pain in the right hip and cannot move it. She has no numbness or tingling in the hip. The pain initially was severe but now she says it does not hurt if she doesn't move it. She does not want pain medicine. She and her family reports she had her last hip done at [facility] and they want to go back to [facility] and have it done again because we do not have a diabetic management care team here. She is on an insulin pump. She had to go to [facility] last year for the same reason.  Past Medical History:  Diagnosis Date  . Arthritis   . Asthma   . Coronary artery disease   . GERD (gastroesophageal reflux disease)     There are no active problems to display for this patient.   History reviewed. No pertinent surgical history.  Prior to Admission medications   Medication Sig Start Date End Date Taking? Authorizing Provider  acetaminophen (RA ACETAMINOPHEN) 650 MG CR tablet Take 2 tablets by mouth 2 (two) times daily. As needed for back pain 08/31/09   Historical Provider, MD  Artificial Tear Solution (TEARS NATURALE OP) Apply 1 drop to eye every 4 (four) hours as needed. For dry eye    Historical Provider, MD  Ascorbic Acid (VITAMIN C) 100 MG tablet Take 1 tablet by mouth daily. 02/20/09   Historical Provider, MD  aspirin 81 MG tablet Take 1 tablet by mouth daily. 12/01/09   Historical Provider, MD  Biotin (BIOTIN MAXIMUM STRENGTH) 10 MG TABS Take 1,000 mg by mouth daily.     Historical Provider, MD  budesonide  (PULMICORT FLEXHALER) 180 MCG/ACT inhaler as needed. 05/22/12   Historical Provider, MD  Calcium Carb-Cholecalciferol (CALCIUM-VITAMIN D3) 600-400 MG-UNIT CAPS Take 1 tablet by mouth daily. 02/20/09   Historical Provider, MD  celecoxib (CELEBREX) 200 MG capsule Take 200 mg by mouth daily. 05/23/14   Historical Provider, MD  citalopram (CELEXA) 10 MG tablet Take 1 tablet by mouth daily. 12/01/14 12/01/15  Historical Provider, MD  clopidogrel (PLAVIX) 75 MG tablet Take 75 mg by mouth daily. 05/22/12   Historical Provider, MD  fluticasone (FLONASE) 50 MCG/ACT nasal spray Place into both nostrils as needed for allergies or rhinitis.    Historical Provider, MD  gabapentin (NEURONTIN) 100 MG capsule Take 200 mg by mouth 2 (two) times daily. 1 capsule in AM and 2 capsules in PM    Historical Provider, MD  glucose blood (ONE TOUCH ULTRA TEST) test strip 6 strips daily. Reported on 09/05/2015 12/01/09   Historical Provider, MD  glucose blood (ONE TOUCH ULTRA TEST) test strip Test up to 8 times daily 05/23/15   Historical Provider, MD  insulin glargine (LANTUS) 100 UNIT/ML injection Inject 11 Units into the skin daily. OFF PUMP PLAN 12/01/14   Historical Provider, MD  insulin lispro (HUMALOG) 100 UNIT/ML injection Inject 70 Units into the skin. Up to 70 units in insulin pump 09/04/15   Historical Provider, MD  levothyroxine (SYNTHROID, LEVOTHROID)  112 MCG tablet Take 1 tablet by mouth daily. 12/01/14   Historical Provider, MD  lisinopril (PRINIVIL,ZESTRIL) 10 MG tablet Take 20 mg by mouth daily. 05/22/12   Historical Provider, MD  magnesium 30 MG tablet Take 500 mg by mouth daily.    Historical Provider, MD  metoprolol succinate (TOPROL-XL) 25 MG 24 hr tablet Take 1 tablet by mouth daily. 08/09/15   Historical Provider, MD  montelukast (SINGULAIR) 10 MG tablet Take 1 tablet by mouth daily. 04/24/15 04/23/16  Historical Provider, MD  Multiple Vitamin (MULTIVITAMIN) capsule Take 1 capsule by mouth daily. 02/20/09   Historical  Provider, MD  pantoprazole (PROTONIX) 40 MG tablet Take 1 tablet by mouth daily. 05/05/14   Historical Provider, MD  pravastatin (PRAVACHOL) 20 MG tablet Take 20 mg by mouth daily. 05/21/12   Historical Provider, MD    Allergies Sulfa antibiotics; Atorvastatin; Penicillins; Propoxyphene; Tramadol; and Erythromycin  No family history on file.  Social History Social History  Substance Use Topics  . Smoking status: Never Smoker  . Smokeless tobacco: Not on file  . Alcohol use No    Review of Systems Constitutional: No fever/chills Eyes: No visual changes. ENT: No sore throat. Cardiovascular: Denies chest pain. Respiratory: Denies shortness of breath. Gastrointestinal: No abdominal pain.  No nausea, no vomiting.  No diarrhea.  No constipation. Genitourinary: Negative for dysuria. Musculoskeletal: Negative for back pain. Skin: Negative for rash. Neurological: Negative for headaches, focal weakness or numbness.  10-point ROS otherwise negative.  ____________________________________________   PHYSICAL EXAM:  VITAL SIGNS: ED Triage Vitals [05/30/16 1041]  Enc Vitals Group     BP (!) 163/6     Pulse Rate 81     Resp 20     Temp 97.8 F (36.6 C)     Temp src      SpO2 100 %     Weight 148 lb (67.1 kg)     Height 5\' 6"  (1.676 m)     Head Circumference      Peak Flow      Pain Score 10     Pain Loc      Pain Edu?      Excl. in GC?     Constitutional: Alert and oriented. Well appearing and in no acute distress! Eyes: Conjunctivae are normal. PERRL. EOMI. Head: Atraumatic. Nose: No congestion/rhinnorhea. Mouth/Throat: Mucous membranes are moist.  Oropharynx non-erythematous. Neck: No stridor. No neck tenderness Cardiovascular: Normal rate, regular rhythm. Grossly normal heart sounds.  Good peripheral circulation. Respiratory: Normal respiratory effort.  No retractions. Lungs CTAB. Gastrointestinal: Soft and nontender. No distention. No abdominal bruits. No CVA  tenderness. Musculoskeletal: Left leg is normal right leg is bent and outwardly rotated. Normal distal pulses and sensation. She has a lot of pain on palpation of the right hip. Neurologic:  Normal speech and language. No gross focal neurologic deficits are appreciated. No gait instability. Skin:  Skin is warm, dry and intact. No rash noted. Psychiatric: Mood and affect are normal. Speech and behavior are normal.  ____________________________________________   LABS (all labs ordered are listed, but only abnormal results are displayed)  Labs Reviewed  COMPREHENSIVE METABOLIC PANEL - Abnormal; Notable for the following:       Result Value   Glucose, Bld 156 (*)    BUN 27 (*)    Total Protein 6.3 (*)    GFR calc non Af Amer 57 (*)    All other components within normal limits  CBC WITH DIFFERENTIAL/PLATELET - Abnormal; Notable  for the following:    RBC 3.59 (*)    Hemoglobin 11.1 (*)    HCT 33.4 (*)    All other components within normal limits  GLUCOSE, CAPILLARY - Abnormal; Notable for the following:    Glucose-Capillary 142 (*)    All other components within normal limits  PROTIME-INR  URINALYSIS, COMPLETE (UACMP) WITH MICROSCOPIC  TYPE AND SCREEN   ____________________________________________  EKG  KG shows normal sinus rhythm rate of 81 left axis no acute ST-T wave changes there are waves in V1 which would imply the presence of a flutter but I do not see flutter waves in any other leads. The computer is reading ST elevation inferiorly but that actually his PR segment depression inferiorly ____________________________________________  RADIOLOGY  Study Result   CLINICAL DATA:  RIGHT hip pain post fall at home today, unable to walk or stand, pain with movement, history asthma, GERD, coronary artery disease  EXAM: CHEST 1 VIEW  COMPARISON:  None  FINDINGS: Normal heart size, mediastinal contours, and pulmonary vascularity.  Lungs clear.  No pleural effusion or  pneumothorax.  Bones appear demineralized.  IMPRESSION: No acute abnormalities.   Electronically Signed   By: Ulyses SouthwardMark  Boles M.D.   On: 05/30/2016 11:44   Study Result   CLINICAL DATA:  Pain.  EXAM: DG HIP (WITH OR WITHOUT PELVIS) 2-3V RIGHT  COMPARISON:  05/30/2016 .  FINDINGS: Lumbar spine scoliosis concave right. Subtrochanteric displaced angulated overriding right hip fracture noted. Total left hip replacement .  IMPRESSION: Sub trochanteric displaced, angulated, overriding right hip fracture.   Electronically Signed   By: Maisie Fushomas  Register   On: 05/30/2016 11:45    ____________________________________________   PROCEDURES  Procedure(s) performed:  Procedures  Critical Care performed:   ____________________________________________   INITIAL IMPRESSION / ASSESSMENT AND PLAN / ED COURSE  Pertinent labs & imaging results that were available during my care of the patient were reviewed by me and considered in my medical decision making (see chart for details).    Clinical Course      ____________________________________________   FINAL CLINICAL IMPRESSION(S) / ED DIAGNOSES  Final diagnoses:  Closed fracture of right hip, initial encounter (HCC)      NEW MEDICATIONS STARTED DURING THIS VISIT:  New Prescriptions   No medications on file     Note:  This document was prepared using Dragon voice recognition software and may include unintentional dictation errors.    Arnaldo NatalPaul F Malinda, MD 05/30/16 1246

## 2016-05-31 LAB — GLUCOSE, CAPILLARY: Glucose-Capillary: 116 mg/dL — ABNORMAL HIGH (ref 65–99)

## 2016-07-10 ENCOUNTER — Other Ambulatory Visit: Payer: Self-pay | Admitting: Physician Assistant

## 2016-07-10 DIAGNOSIS — M25561 Pain in right knee: Secondary | ICD-10-CM

## 2016-07-12 ENCOUNTER — Ambulatory Visit
Admission: RE | Admit: 2016-07-12 | Discharge: 2016-07-12 | Disposition: A | Discharge status: Home / Self Care | Payer: Medicare Other | Source: Ambulatory Visit | Attending: Physician Assistant | Admitting: Physician Assistant

## 2016-07-12 DIAGNOSIS — M25561 Pain in right knee: Secondary | ICD-10-CM | POA: Diagnosis not present

## 2017-02-03 ENCOUNTER — Other Ambulatory Visit: Payer: Self-pay | Admitting: Infectious Diseases

## 2017-02-03 DIAGNOSIS — Z1231 Encounter for screening mammogram for malignant neoplasm of breast: Secondary | ICD-10-CM

## 2017-02-18 ENCOUNTER — Ambulatory Visit
Admission: RE | Admit: 2017-02-18 | Discharge: 2017-02-18 | Disposition: A | Discharge status: Home / Self Care | Payer: Medicare Other | Source: Ambulatory Visit | Attending: Infectious Diseases | Admitting: Infectious Diseases

## 2017-02-18 DIAGNOSIS — Z1231 Encounter for screening mammogram for malignant neoplasm of breast: Secondary | ICD-10-CM | POA: Diagnosis not present

## 2017-08-07 ENCOUNTER — Ambulatory Visit
Admission: EM | Admit: 2017-08-07 | Discharge: 2017-08-07 | Disposition: A | Discharge status: Home / Self Care | Payer: Medicare Other | Attending: Family Medicine | Admitting: Family Medicine

## 2017-08-07 ENCOUNTER — Encounter: Payer: Self-pay | Admitting: *Deleted

## 2017-08-07 ENCOUNTER — Other Ambulatory Visit: Payer: Self-pay

## 2017-08-07 DIAGNOSIS — M79675 Pain in left toe(s): Secondary | ICD-10-CM

## 2017-08-07 HISTORY — DX: Type 2 diabetes mellitus without complications: E11.9

## 2017-08-07 NOTE — Discharge Instructions (Addendum)
Rest.  Tylenol as needed.  Keep a close eye.  No evidence of infection at this time.  Take care  Dr. Adriana Simasook

## 2017-08-07 NOTE — ED Provider Notes (Signed)
[facility]    CSN: [id number] Arrival date & time: 08/07/17  1721  History   Chief Complaint Great toe pain.  HPI 84 year old female with a complicated past medical history presents with left great toe pain.  Patient reports that this is been ongoing for the past few days.  No reported trauma, fall, injury.  Patient states that her toe is red.  She states that the pain is around the nail.  Her primary concern is infection as she is a diabetic.  No fever or chills.  No known exacerbating relieving factors.  No other associated symptoms.  No other complaints.  PMH: Type 1 diabetes mellitus (CMS-HCC)    Diabetic retinopathy associated with type 1 diabetes mellitus (CMS-HCC)    Diabetic peripheral neuropathy associated with type 1 diabetes mellitus (CMS-HCC)    HTN (hypertension)    Hypothyroidism, unspecified    Osteoporosis    Hypercholesterolemia    Coronary artery disease s/p stent 2007    Insulin pump status    DJD (degenerative joint disease) of spine    COPD (chronic obstructive pulmonary disease) , unspecified (CMS-HCC)    Depression, unspecified    Allergic state    Osteopenia    Asthma without status asthmaticus, unspecified    Atrophic vaginitis    PONV (postoperative nausea and vomiting), unspecified    Anemia, unspecified     Surgical Hx: HYSTERECTOMY 1975  without oophorectomy  CHOLECYSTECTOMY     APPENDECTOMY     DESTRUCTION PROGRESSIVE RETINOPATHY BY LASER 12/2000    DILATION AND CURETTAGE, DIAGNOSTIC / THERAPEUTIC     TONSILLECTOMY     ARTHROPLASTY HIP TOTAL 05/29/2015 Left Procedure: ARTHROPLASTY, ACETABULAR AND PROXIMAL FEMORAL PROSTHETIC REPLACEMENT (TOTAL HIP ARTHROPLASTY), WITH OR WITHOUT AUTOGRAFT OR ALLOGRAFT; Surgeon: Currie ParisMichael Paul Bolognesi, MD; Location: [facility]; Service: Orthopedics; Laterality: Left;  OPEN REDUCTION GREATER TROCHANTER FRACTURE 05/31/2016 Right Procedure: TREATMENT OF  INTERTROCHANTERIC, PERITROCHANTERIC, OR SUBTROCHANTERIC FEMORAL FRACTURE; WITH INTRAMEDULLARY IMPLANT, WITH OR WITHOUT INTERLOCKING SCREWS AND/OR CERCLAGE; Surgeon: Dina RichMark Joseph Gage, MD; Location: [facility]; Service: Orthopedics; Laterality: Right;  FEMUR IM ROD REMOVAL 08/26/2016 Right Procedure: REMOVAL INTRAMEDULLARY NAIL FEMUR; Surgeon: Dina RichMark Joseph Gage, MD; Location: [facility]; Service: Orthopedics; Laterality: Right;  REPAIR NONUNION/MALUNION FEMUR W/GRAFT 08/26/2016 Right Procedure: REPAIR, NONUNION OR MALUNION, FEMUR, DISTAL TO HEAD/NECK; WITH ILIAC OR OTHER AUTOGENOUS BONE GRAFT (INCLUDES OBTAINING GRAFT); Surgeon: Dina RichMark Joseph Gage, MD; Location: [facility]; Service: Orthopedics; Laterality: Right; Fracture table, fluoroscopy, smith and nephew IM nail  BIOPSY EXCAL BONE PELVIS 08/26/2016 Right Procedure: BIOPSY, BONE, OPEN; DEEP HIP / PELVIS; Surgeon: Dina RichMark Joseph Gage, MD; Location: [facility]; Service: Orthopedics; Laterality: Right;  JOINT REPLACEMENT   hip arthroplasty  APPLICATION ARM SPLINT 04/09/2017 Left Procedure: APPLICATION OF SHORT ARM SPLINT (FOREARM TO HAND); STATIC; Surgeon: Asencion IslamPidgeon, Tyler Steven, MD; Location: [facility]; Service: Orthopedics; Laterality: Left;  INTRAOPERATIVE FLOUROSCOPY 04/09/2017 Left Procedure: FLUOROSCOPY (SEPARATE PROCEDURE), UP TO 1 HOUR PHYSICIAN OR OTHER QUALIFIED HEALTH CARE PROFESSIONAL TIME; Surgeon: Asencion IslamPidgeon, Tyler Steven, MD; Location: [facility]; Service: Orthopedics; Laterality: Left;    Home Medications    Prior to Admission medications   Medication Sig Start Date End Date Taking? Authorizing Provider  acetaminophen (RA ACETAMINOPHEN) 650 MG CR tablet Take 2 tablets by mouth 2 (two) times daily. As needed for back pain 08/31/09  Yes [provider]  Artificial Tear Solution (TEARS NATURALE OP) Apply 1 drop to eye every 4 (four) hours as needed. For dry eye  Yes [provider]  Ascorbic Acid (VITAMIN C) 100 MG  tablet Take 1 tablet by mouth daily. 02/20/09  Yes [provider]  aspirin 81 MG tablet Take 1 tablet by mouth daily. 12/01/09  Yes [provider]  Biotin (BIOTIN MAXIMUM STRENGTH) 10 MG TABS Take 1,000 mg by mouth daily.    Yes [provider]  budesonide (PULMICORT FLEXHALER) 180 MCG/ACT inhaler as needed. 05/22/12  Yes [provider]  Calcium Carb-Cholecalciferol (CALCIUM-VITAMIN D3) 600-400 MG-UNIT CAPS Take 1 tablet by mouth daily. 02/20/09  Yes [provider]  clopidogrel (PLAVIX) 75 MG tablet Take 75 mg by mouth daily. 05/22/12  Yes [provider]  fluticasone (FLONASE) 50 MCG/ACT nasal spray Place into both nostrils as needed for allergies or rhinitis.   Yes [provider]  gabapentin (NEURONTIN) 100 MG capsule Take 200 mg by mouth 2 (two) times daily. 1 capsule in AM and 2 capsules in PM   Yes [provider]  glucose blood (ONE TOUCH ULTRA TEST) test strip 6 strips daily. Reported on 09/05/2015 12/01/09  Yes [provider]  glucose blood (ONE TOUCH ULTRA TEST) test strip Test up to 8 times daily 05/23/15  Yes [provider]  hydroxypropyl methylcellulose / hypromellose (ISOPTO TEARS / GONIOVISC) 2.5 % ophthalmic solution Apply to eye.   Yes [provider]  insulin lispro (HUMALOG) 100 UNIT/ML injection Inject 70 Units into the skin. Up to 70 units in insulin pump 09/04/15  Yes [provider]  levothyroxine (SYNTHROID, LEVOTHROID) 112 MCG tablet Take 1 tablet by mouth daily. 12/01/14  Yes [provider]  lisinopril (PRINIVIL,ZESTRIL) 10 MG tablet Take 20 mg by mouth daily. 05/22/12  Yes [provider]  magnesium 30 MG tablet Take 500 mg by mouth daily.   Yes [provider]  Magnesium Oxide 500 MG TABS Take by mouth.   Yes [provider]  metoprolol tartrate (LOPRESSOR) 25 MG tablet Take 25 mg by mouth. 05/22/12  Yes [provider]  Multiple  Vitamin (MULTIVITAMIN) capsule Take 1 capsule by mouth daily. 02/20/09  Yes [provider]  pantoprazole (PROTONIX) 40 MG tablet Take 1 tablet by mouth daily. 05/05/14  Yes [provider]  pravastatin (PRAVACHOL) 20 MG tablet Take 20 mg by mouth daily. 05/21/12  Yes [provider]  PROPYLENE GLYCOL OP Apply to eye.   Yes [provider]  citalopram (CELEXA) 10 MG tablet Take 1 tablet by mouth daily. 12/01/14 12/01/15  [provider]  metoprolol succinate (TOPROL-XL) 25 MG 24 hr tablet Take 1 tablet by mouth daily. 08/09/15   [provider]  montelukast (SINGULAIR) 10 MG tablet Take 1 tablet by mouth daily. 04/24/15 04/23/16  [provider]    Family History Prostate cancer Brother    Kidney disease Father    Heart disease Maternal Aunt    High blood pressure (Hypertension) Maternal Aunt    Breast cancer Mother    Diabetes Mother    Diabetes type II Mother    Heart disease Mother    High blood pressure (Hypertension) Mother    Hip fracture Mother    Osteoporosis (Thinning of bones) Mother    Heart disease Paternal Grandfather    Heart disease Paternal Grandmother    Multiple endocrine neoplasia Paternal Uncle    Anesthesia problems Neg Hx    Malignant hyperthermia Neg Hx     Social History Social History   Tobacco Use  . Smoking status: Never Smoker  .  Smokeless tobacco: Never Used  Substance Use Topics  . Alcohol use: No    Alcohol/week: 0.0 oz  . Drug use: Never     Allergies   Nitrofurantoin; Sulfa antibiotics; Atorvastatin; Penicillins; Propoxyphene; Tramadol; and Erythromycin   Review of Systems Review of Systems  Constitutional: Negative.   Musculoskeletal:       Left great toe pain.   Physical Exam Triage Vital Signs ED Triage Vitals  Enc Vitals Group     BP --      Pulse Rate 08/07/17 1740 67     Resp 08/07/17 1740 16     Temp 08/07/17 1740 98 F (36.7 C)      Temp Source 08/07/17 1740 Oral     SpO2 08/07/17 1740 100 %     Weight 08/07/17 1742 145 lb (65.8 kg)     Height 08/07/17 1742 5\' 5"  (1.651 m)     Head Circumference --      Peak Flow --      Pain Score 08/07/17 1740 4     Pain Loc --      Pain Edu? --      Excl. in GC? --    Updated Vital Signs Pulse 67   Temp 98 F (36.7 C) (Oral)   Resp 16   Ht 5\' 5"  (1.651 m)   Wt 145 lb (65.8 kg)   SpO2 100%   BMI 24.13 kg/m     Physical Exam  Constitutional: She is oriented to person, place, and time. She appears well-developed. No distress.  HENT:  Head: Normocephalic and atraumatic.  Pulmonary/Chest: Effort normal. No respiratory distress.  Musculoskeletal:  Normal range of motion of the great toe.  She does have a bunion.  Neurological: She is alert and oriented to person, place, and time.  Skin:  No open wound of the left great toe.  Good capillary refill.  Psychiatric: She has a normal mood and affect. Her behavior is normal.  Nursing note and vitals reviewed.  UC Treatments / Results  Labs (all labs ordered are listed, but only abnormal results are displayed) Labs Reviewed - No data to display  EKG  EKG Interpretation None       Radiology No results found.  Procedures Procedures (including critical care time)  Medications Ordered in UC Medications - No data to display   Initial Impression / Assessment and Plan / UC Course  I have reviewed the triage vital signs and the nursing notes.  Pertinent labs & imaging results that were available during my care of the patient were reviewed by me and considered in my medical decision making (see chart for details).    84 year old female presents with left great toe pain.  I cannot find a source for her pain other than having a large bunion.  There is no open wound.  No significant erythema.  No evidence of infection.  Advised supportive care.  Tylenol as needed.  Final Clinical Impressions(s) / UC Diagnoses   Final  diagnoses:  Great toe pain, left    ED Discharge Orders    None     Controlled Substance Prescriptions [place] Controlled Substance Registry consulted? Not Applicable   Tommie Sams, DO 08/07/17 1824

## 2018-02-19 ENCOUNTER — Telehealth: Payer: Self-pay | Admitting: Urology

## 2018-02-19 ENCOUNTER — Other Ambulatory Visit: Payer: Self-pay | Admitting: Family Medicine

## 2018-02-19 MED ORDER — OXYBUTYNIN CHLORIDE 5 MG PO TABS: 5.0000 mg | ORAL_TABLET | Freq: Two times a day (BID) | ORAL | 0 refills | Status: DC

## 2018-02-19 NOTE — Telephone Encounter (Signed)
Patient called the office today to make a new patient appointment with Dr. Lonna Cobb to transfer care from Dr. Achilles Dunk.  Oxybutynin 5mg  to be sent to the [facility] for 90 days.    Patient can be reached at [phone].

## 2018-02-19 NOTE — Telephone Encounter (Signed)
RX filled and sent to [facility].

## 2018-02-25 ENCOUNTER — Other Ambulatory Visit: Payer: Self-pay | Admitting: Internal Medicine

## 2018-02-25 DIAGNOSIS — Z1231 Encounter for screening mammogram for malignant neoplasm of breast: Secondary | ICD-10-CM

## 2018-03-03 ENCOUNTER — Ambulatory Visit
Admission: RE | Admit: 2018-03-03 | Discharge: 2018-03-03 | Disposition: A | Discharge status: Home / Self Care | Payer: Medicare Other | Source: Ambulatory Visit | Attending: Internal Medicine | Admitting: Internal Medicine

## 2018-03-03 DIAGNOSIS — Z1231 Encounter for screening mammogram for malignant neoplasm of breast: Secondary | ICD-10-CM | POA: Diagnosis not present

## 2018-05-04 ENCOUNTER — Other Ambulatory Visit: Payer: Self-pay

## 2018-05-04 MED ORDER — OXYBUTYNIN CHLORIDE 5 MG PO TABS: 5.0000 mg | ORAL_TABLET | Freq: Two times a day (BID) | ORAL | 0 refills | Status: DC

## 2018-05-26 ENCOUNTER — Encounter: Payer: Self-pay | Admitting: Urology

## 2018-05-26 ENCOUNTER — Ambulatory Visit (INDEPENDENT_AMBULATORY_CARE_PROVIDER_SITE_OTHER): Payer: Medicare Other | Admitting: Urology

## 2018-05-26 VITALS — BP 104/60 | HR 84 | Ht 65.5 in | Wt 147.7 lb

## 2018-05-26 DIAGNOSIS — N3281 Overactive bladder: Secondary | ICD-10-CM | POA: Diagnosis not present

## 2018-05-26 MED ORDER — OXYBUTYNIN CHLORIDE ER 5 MG PO TB24: 10.0000 mg | ORAL_TABLET | Freq: Every day | ORAL | 0 refills | Status: DC

## 2018-05-26 NOTE — Progress Notes (Signed)
05/26/2018 2:17 PM   Lindsey Padilla August 02, 1932 [id number]  Referring provider: Mick Sell, MD [address] [place], [place] [address]  Chief Complaint  Patient presents with  . Urinary Incontinence    HPI: 85 year old female previously followed by Dr. Achilles Dunk for overactive bladder with urge incontinence.  She last saw him in January 2019.  She is taking immediate release oxybutynin twice daily.  She states she does well until approximately 4:00 in the afternoon and then will have increased frequency, urgency with urge incontinence.  She also has nocturia x3.  She sleeps with a bedside commode which prevents nighttime incontinence.  Denies dysuria, gross hematuria or flank/abdominal/pelvic pain.   PMH: Past Medical History:  Diagnosis Date  . Arthritis   . Asthma   . Coronary artery disease   . Diabetes mellitus without complication (HCC)   . GERD (gastroesophageal reflux disease)     Surgical History: Past Surgical History:  Procedure Laterality Date  . ABDOMINAL HYSTERECTOMY    . APPENDECTOMY    . BREAST CYST ASPIRATION Right    neg  . CHOLECYSTECTOMY    . FRACTURE SURGERY    . JOINT REPLACEMENT      Home Medications:  Allergies as of 05/26/2018      Reactions   Nitrofurantoin Shortness Of Breath   BS drops, wheezing   Sulfa Antibiotics Swelling   Atorvastatin Other (See Comments)   Knee/thigh pain Other reaction(s): Other (See Comments) Knee/thigh pain Knee/thigh pain   Penicillins Other (See Comments)   Other reaction(s): Other (See Comments) Patient does not remember   Propoxyphene Other (See Comments)   Other reaction(s): Other (See Comments), Unknown   Tramadol Other (See Comments), Nausea And Vomiting   Other reaction(s): Other (See Comments)   Erythromycin Rash      Medication List       Accurate as of May 26, 2018  2:17 PM. Always use your most recent med list.        aspirin 81 MG tablet Take 1 tablet by  mouth daily.   BIOTIN MAXIMUM STRENGTH 10 MG Tabs Generic drug:  Biotin Take 1,000 mg by mouth daily.   Calcium-Vitamin D3 600-400 MG-UNIT Caps Take 1 tablet by mouth daily.   citalopram 10 MG tablet Commonly known as:  CELEXA Take 1 tablet by mouth daily.   docusate sodium 50 MG capsule Commonly known as:  COLACE Take by mouth.   estradiol 0.1 MG/GM vaginal cream Commonly known as:  ESTRACE Place vaginally.   fluticasone 50 MCG/ACT nasal spray Commonly known as:  FLONASE Place into both nostrils as needed for allergies or rhinitis.   gabapentin 100 MG capsule Commonly known as:  NEURONTIN Take 200 mg by mouth 2 (two) times daily. 1 capsule in AM and 2 capsules in PM   HUMALOG 100 UNIT/ML injection Generic drug:  insulin lispro Inject 70 Units into the skin. Up to 70 units in insulin pump   hydroxypropyl methylcellulose / hypromellose 2.5 % ophthalmic solution Commonly known as:  ISOPTO TEARS / GONIOVISC Apply to eye.   LACTOBACILLUS PO Take by mouth.   levothyroxine 112 MCG tablet Commonly known as:  SYNTHROID, LEVOTHROID Take 1 tablet by mouth daily.   lisinopril 10 MG tablet Commonly known as:  PRINIVIL,ZESTRIL Take 20 mg by mouth daily.   magnesium 30 MG tablet Take 500 mg by mouth daily.   Magnesium Gluconate 550 MG Tabs Take by mouth.   Magnesium Oxide 500 MG Tabs  Take by mouth.   metoprolol succinate 25 MG 24 hr tablet Commonly known as:  TOPROL-XL Take 1 tablet by mouth daily.   metoprolol tartrate 25 MG tablet Commonly known as:  LOPRESSOR Take 25 mg by mouth.   montelukast 10 MG tablet Commonly known as:  SINGULAIR Take 1 tablet by mouth daily.   multivitamin capsule Take 1 capsule by mouth daily.   ONE TOUCH ULTRA TEST test strip Generic drug:  glucose blood 6 strips daily. Reported on 09/05/2015   ONE TOUCH ULTRA TEST test strip Generic drug:  glucose blood Test up to 8 times daily   oxybutynin 5 MG tablet Commonly known as:   DITROPAN Take 1 tablet (5 mg total) by mouth 2 (two) times daily.   pantoprazole 40 MG tablet Commonly known as:  PROTONIX Take 1 tablet by mouth daily.   PLAVIX 75 MG tablet Generic drug:  clopidogrel Take 75 mg by mouth daily.   pravastatin 20 MG tablet Commonly known as:  PRAVACHOL Take 20 mg by mouth daily.   PROPYLENE GLYCOL OP Apply to eye.   PULMICORT FLEXHALER 180 MCG/ACT inhaler Generic drug:  budesonide as needed.   RA ACETAMINOPHEN 650 MG CR tablet Generic drug:  acetaminophen Take 2 tablets by mouth 2 (two) times daily. As needed for back pain   TEARS NATURALE OP Apply 1 drop to eye every 4 (four) hours as needed. For dry eye   vitamin C 100 MG tablet Take 1 tablet by mouth daily.       Allergies:  Allergies  Allergen Reactions  . Nitrofurantoin Shortness Of Breath    BS drops, wheezing  . Sulfa Antibiotics Swelling  . Atorvastatin Other (See Comments)    Knee/thigh pain Other reaction(s): Other (See Comments) Knee/thigh pain Knee/thigh pain  . Penicillins Other (See Comments)    Other reaction(s): Other (See Comments) Patient does not remember  . Propoxyphene Other (See Comments)    Other reaction(s): Other (See Comments), Unknown  . Tramadol Other (See Comments) and Nausea And Vomiting    Other reaction(s): Other (See Comments)  . Erythromycin Rash    Family History: Family History  Problem Relation Age of Onset  . Breast cancer Paternal Aunt     Social History:  reports that she has never smoked. She has never used smokeless tobacco. She reports that she does not drink alcohol or use drugs.  ROS: UROLOGY Frequent Urination?: No Hard to postpone urination?: No Burning/pain with urination?: No Get up at night to urinate?: Yes Leakage of urine?: Yes Urine stream starts and stops?: No Trouble starting stream?: No Do you have to strain to urinate?: No Blood in urine?: No Urinary tract infection?: No Sexually transmitted disease?:  No Injury to kidneys or bladder?: No Painful intercourse?: No Weak stream?: No Currently pregnant?: No Vaginal bleeding?: No Last menstrual period?: Hysterectomy  Gastrointestinal Nausea?: No Vomiting?: No Indigestion/heartburn?: No Diarrhea?: No Constipation?: No  Constitutional Fever: No Night sweats?: No Weight loss?: No Fatigue?: No  Skin Skin rash/lesions?: No Itching?: No  Eyes Blurred vision?: No Double vision?: No  Ears/Nose/Throat Sore throat?: No Sinus problems?: No  Hematologic/Lymphatic Swollen glands?: No Easy bruising?: No  Cardiovascular Leg swelling?: No Chest pain?: No  Respiratory Cough?: No Shortness of breath?: No  Endocrine Excessive thirst?: No  Musculoskeletal Back pain?: No Joint pain?: No  Neurological Headaches?: No Dizziness?: No  Psychologic Depression?: No Anxiety?: No  Physical Exam: BP 104/60 (BP Location: Left Arm, Patient Position: Sitting, Cuff Size:  Normal)   Pulse 84   Ht 5' 5.5" (1.664 m)   Wt 147 lb 11.2 oz (67 kg)   BMI 24.20 kg/m   Constitutional:  Alert and oriented, No acute distress. HEENT: [place] AT, moist mucus membranes.  Trachea midline, no masses. Cardiovascular: No clubbing, cyanosis, or edema. Respiratory: Normal respiratory effort, no increased work of breathing. GI: Abdomen is soft, nontender, nondistended, no abdominal masses GU: No CVA tenderness Lymph: No cervical or inguinal lymphadenopathy. Skin: No rashes, bruises or suspicious lesions. Neurologic: Grossly intact, no focal deficits, moving all 4 extremities. Psychiatric: Normal mood and affect.   Assessment & Plan:   85 year old female with overactive bladder and urge incontinence.  She has no significant side effects with immediate release oxybutynin.  Will change her to an extended release version and see if this gives her extended symptomatic improvement.  She was instructed to call back approximately 1 month regarding the  medication efficacy.  If no improvement we will either titrate the dose or give a trial of Myrbetriq.  Continue annual follow-up.   Riki AltesScott C Stoioff, MD  [facility] [address], [address] [place], [place] [id number] [phone]

## 2018-05-29 ENCOUNTER — Other Ambulatory Visit: Payer: Self-pay

## 2018-05-29 MED ORDER — OXYBUTYNIN CHLORIDE ER 5 MG PO TB24: 10.0000 mg | ORAL_TABLET | Freq: Every day | ORAL | 0 refills | Status: DC

## 2018-07-16 ENCOUNTER — Telehealth: Payer: Self-pay | Admitting: Urology

## 2018-07-16 NOTE — Telephone Encounter (Signed)
Patient called to report that she has been taking oxybutynin for a month and it is not helping with her symptoms.  She is asking for advice on what to do next.

## 2018-07-17 NOTE — Telephone Encounter (Signed)
Would have her pick up Myrbetriq 25 mg samples.  1 tab daily.  Give 4 weeks worth

## 2018-07-20 NOTE — Telephone Encounter (Signed)
Patient informed samples left up front for her to pick up.

## 2018-08-20 ENCOUNTER — Other Ambulatory Visit: Payer: Self-pay | Admitting: Urology

## 2018-08-20 MED ORDER — MIRABEGRON ER 25 MG PO TB24: 25.0000 mg | ORAL_TABLET | Freq: Every day | ORAL | 0 refills | Status: DC

## 2018-08-20 NOTE — Telephone Encounter (Signed)
Myrbetriq sent in-patient notified.

## 2018-08-20 NOTE — Telephone Encounter (Signed)
Pt states she was given samples of 25mg  Myrbetriq and felt that it was starting to somewhat help her, however she ran out of samples before she could make a definitive decision, asks if she can have a 30 day supply Rx sent to her local pharmacy [facility] in [place]. Please advise.

## 2018-08-31 ENCOUNTER — Telehealth: Payer: Self-pay | Admitting: Urology

## 2018-08-31 MED ORDER — CIPROFLOXACIN HCL 250 MG PO TABS: 250.0000 mg | ORAL_TABLET | Freq: Two times a day (BID) | ORAL | 0 refills | Status: AC

## 2018-08-31 NOTE — Telephone Encounter (Signed)
We can send in some Cipro 250 mg, BID x 5 days.

## 2018-08-31 NOTE — Telephone Encounter (Signed)
Cipro sent to [facility]. Called and advised patient. Patient verbalized understanding.

## 2018-08-31 NOTE — Telephone Encounter (Signed)
Patient is calling with symptoms of a UTI She has frequency, burning with urination, lower back pain and vaginal irration  Patient would like something called in for this to [facility] in [place].    Marcelino Duster

## 2018-09-17 ENCOUNTER — Telehealth: Payer: Self-pay | Admitting: Urology

## 2018-09-17 NOTE — Telephone Encounter (Signed)
She was on the immediate release oxybutynin that Dr. Achilles Dunk had her on.  I tried her on both extended release oxybutynin and Myrbetriq which have not been effective.  There are not really any other medication options that will be effective.  I can put her back on the oxybutynin that Dr. Achilles Dunk had her on or can set her up to see Carollee Herter to discuss PTNS.

## 2018-09-17 NOTE — Telephone Encounter (Signed)
Pt needs to get something different, Myrbetriq 25 mg constipates her and is too expensive $145 for a 30 day supply.  Please call pt

## 2018-09-17 NOTE — Telephone Encounter (Signed)
Informed patient as directed-patient would like to try Oxybutynin for right now.

## 2018-09-18 MED ORDER — OXYBUTYNIN CHLORIDE 5 MG PO TABS: 5.0000 mg | ORAL_TABLET | Freq: Two times a day (BID) | ORAL | 3 refills | Status: DC

## 2018-09-18 NOTE — Addendum Note (Signed)
Addended by: Irineo Axon C on: 09/18/2018 11:43 AM   Modules accepted: Orders

## 2018-10-20 ENCOUNTER — Other Ambulatory Visit: Payer: Self-pay | Admitting: Urology

## 2018-10-20 MED ORDER — OXYBUTYNIN CHLORIDE 5 MG PO TABS: 5.0000 mg | ORAL_TABLET | Freq: Two times a day (BID) | ORAL | 3 refills | Status: DC

## 2018-10-20 NOTE — Telephone Encounter (Signed)
RX has been changed to [facility]

## 2018-10-20 NOTE — Telephone Encounter (Signed)
Patient called asking for her Oxybutynin to be changed over to [facility] and changed to a 90 days supply. She currently uses [facility] but she wants to stop that. Please call her and let her know if this can be changed.   Thanks, Marcelino Duster

## 2018-11-16 ENCOUNTER — Other Ambulatory Visit: Payer: Self-pay | Admitting: Internal Medicine

## 2018-11-16 DIAGNOSIS — R1013 Epigastric pain: Secondary | ICD-10-CM

## 2018-11-19 ENCOUNTER — Other Ambulatory Visit: Payer: Self-pay

## 2018-11-19 ENCOUNTER — Encounter (INDEPENDENT_AMBULATORY_CARE_PROVIDER_SITE_OTHER): Payer: Self-pay

## 2018-11-19 ENCOUNTER — Ambulatory Visit
Admission: RE | Admit: 2018-11-19 | Discharge: 2018-11-19 | Disposition: A | Discharge status: Home / Self Care | Payer: Medicare Other | Source: Ambulatory Visit | Attending: Internal Medicine | Admitting: Internal Medicine

## 2018-11-19 DIAGNOSIS — R1013 Epigastric pain: Secondary | ICD-10-CM | POA: Diagnosis not present

## 2018-11-19 MED ORDER — IOHEXOL 300 MG/ML  SOLN
75.0000 mL | Freq: Once | INTRAMUSCULAR | Status: AC | PRN
  Administered 2018-11-19: 75 mL via INTRAVENOUS

## 2019-01-08 ENCOUNTER — Other Ambulatory Visit: Payer: Self-pay

## 2019-01-08 ENCOUNTER — Other Ambulatory Visit
Admission: RE | Admit: 2019-01-08 | Discharge: 2019-01-08 | Disposition: A | Discharge status: Home / Self Care | Payer: Medicare Other | Source: Ambulatory Visit | Attending: Gastroenterology | Admitting: Gastroenterology

## 2019-01-08 DIAGNOSIS — Z20828 Contact with and (suspected) exposure to other viral communicable diseases: Secondary | ICD-10-CM | POA: Diagnosis not present

## 2019-01-08 DIAGNOSIS — Z01812 Encounter for preprocedural laboratory examination: Secondary | ICD-10-CM | POA: Diagnosis not present

## 2019-01-08 LAB — SARS CORONAVIRUS 2 (TAT 6-24 HRS): SARS Coronavirus 2: NEGATIVE

## 2019-01-11 ENCOUNTER — Encounter: Payer: Self-pay | Admitting: *Deleted

## 2019-01-12 ENCOUNTER — Ambulatory Visit: Payer: Medicare Other | Admitting: Anesthesiology

## 2019-01-12 ENCOUNTER — Encounter: Payer: Self-pay | Admitting: *Deleted

## 2019-01-12 ENCOUNTER — Ambulatory Visit
Admission: RE | Admit: 2019-01-12 | Discharge: 2019-01-12 | Disposition: A | Discharge status: Home / Self Care | Payer: Medicare Other | Attending: Gastroenterology | Admitting: Gastroenterology

## 2019-01-12 ENCOUNTER — Encounter
Admission: RE | Disposition: A | Discharge status: Home / Self Care | Payer: Self-pay | Source: Home / Self Care | Attending: Gastroenterology

## 2019-01-12 ENCOUNTER — Other Ambulatory Visit: Payer: Self-pay

## 2019-01-12 DIAGNOSIS — Z7982 Long term (current) use of aspirin: Secondary | ICD-10-CM | POA: Insufficient documentation

## 2019-01-12 DIAGNOSIS — Z79899 Other long term (current) drug therapy: Secondary | ICD-10-CM | POA: Insufficient documentation

## 2019-01-12 DIAGNOSIS — Z79890 Hormone replacement therapy (postmenopausal): Secondary | ICD-10-CM | POA: Insufficient documentation

## 2019-01-12 DIAGNOSIS — K219 Gastro-esophageal reflux disease without esophagitis: Secondary | ICD-10-CM | POA: Insufficient documentation

## 2019-01-12 DIAGNOSIS — E119 Type 2 diabetes mellitus without complications: Secondary | ICD-10-CM | POA: Diagnosis not present

## 2019-01-12 DIAGNOSIS — I2510 Atherosclerotic heart disease of native coronary artery without angina pectoris: Secondary | ICD-10-CM | POA: Insufficient documentation

## 2019-01-12 DIAGNOSIS — Z7951 Long term (current) use of inhaled steroids: Secondary | ICD-10-CM | POA: Insufficient documentation

## 2019-01-12 DIAGNOSIS — R933 Abnormal findings on diagnostic imaging of other parts of digestive tract: Secondary | ICD-10-CM | POA: Insufficient documentation

## 2019-01-12 DIAGNOSIS — Z794 Long term (current) use of insulin: Secondary | ICD-10-CM | POA: Insufficient documentation

## 2019-01-12 DIAGNOSIS — K2950 Unspecified chronic gastritis without bleeding: Secondary | ICD-10-CM | POA: Diagnosis not present

## 2019-01-12 DIAGNOSIS — K449 Diaphragmatic hernia without obstruction or gangrene: Secondary | ICD-10-CM | POA: Insufficient documentation

## 2019-01-12 DIAGNOSIS — K2960 Other gastritis without bleeding: Secondary | ICD-10-CM | POA: Diagnosis not present

## 2019-01-12 DIAGNOSIS — Z7902 Long term (current) use of antithrombotics/antiplatelets: Secondary | ICD-10-CM | POA: Diagnosis not present

## 2019-01-12 DIAGNOSIS — J45909 Unspecified asthma, uncomplicated: Secondary | ICD-10-CM | POA: Insufficient documentation

## 2019-01-12 HISTORY — PX: ESOPHAGOGASTRODUODENOSCOPY (EGD) WITH PROPOFOL: SHX5813

## 2019-01-12 LAB — GLUCOSE, CAPILLARY: Glucose-Capillary: 198 mg/dL — ABNORMAL HIGH (ref 70–99)

## 2019-01-12 SURGERY — ESOPHAGOGASTRODUODENOSCOPY (EGD) WITH PROPOFOL
Anesthesia: General

## 2019-01-12 MED ORDER — LIDOCAINE HCL (CARDIAC) PF 100 MG/5ML IV SOSY
PREFILLED_SYRINGE | INTRAVENOUS | Status: DC | PRN
  Administered 2019-01-12: 30 mg via INTRAVENOUS

## 2019-01-12 MED ORDER — EPHEDRINE SULFATE 50 MG/ML IJ SOLN
INTRAMUSCULAR | Status: DC | PRN
  Administered 2019-01-12: 5 mg via INTRAVENOUS

## 2019-01-12 MED ORDER — FENTANYL CITRATE (PF) 100 MCG/2ML IJ SOLN
INTRAMUSCULAR | Status: AC
  Filled 2019-01-12: qty 2

## 2019-01-12 MED ORDER — SODIUM CHLORIDE 0.9 % IV SOLN: INTRAVENOUS | Status: DC

## 2019-01-12 MED ORDER — BUTAMBEN-TETRACAINE-BENZOCAINE 2-2-14 % EX AERO
INHALATION_SPRAY | CUTANEOUS | Status: AC
  Filled 2019-01-12: qty 5

## 2019-01-12 MED ORDER — PROPOFOL 500 MG/50ML IV EMUL
INTRAVENOUS | Status: DC | PRN
  Administered 2019-01-12: 100 ug/kg/min via INTRAVENOUS

## 2019-01-12 MED ORDER — EPHEDRINE SULFATE 50 MG/ML IJ SOLN
INTRAMUSCULAR | Status: AC
  Filled 2019-01-12: qty 1

## 2019-01-12 MED ORDER — PROPOFOL 500 MG/50ML IV EMUL
INTRAVENOUS | Status: AC
  Filled 2019-01-12: qty 50

## 2019-01-12 MED ORDER — SODIUM CHLORIDE 0.9 % IV SOLN
INTRAVENOUS | Status: DC | PRN
  Administered 2019-01-12: 08:00:00 via INTRAVENOUS

## 2019-01-12 MED ORDER — FENTANYL CITRATE (PF) 100 MCG/2ML IJ SOLN
INTRAMUSCULAR | Status: DC | PRN
  Administered 2019-01-12 (×2): 50 ug via INTRAVENOUS

## 2019-01-12 NOTE — Anesthesia Procedure Notes (Signed)
Performed by: Cook-Martin, Dina Warbington Pre-anesthesia Checklist: Patient identified, Emergency Drugs available, Suction available, Patient being monitored and Timeout performed Patient Re-evaluated:Patient Re-evaluated prior to induction Oxygen Delivery Method: Nasal cannula Preoxygenation: Pre-oxygenation with 100% oxygen Induction Type: IV induction Airway Equipment and Method: Bite block Placement Confirmation: positive ETCO2 and CO2 detector       

## 2019-01-12 NOTE — Anesthesia Preprocedure Evaluation (Signed)
Anesthesia Evaluation  Patient identified by MRN, date of birth, ID band Patient awake    Reviewed: Allergy & Precautions, H&P , NPO status , Patient's Chart, lab work & pertinent test results, reviewed documented beta blocker date and time   History of Anesthesia Complications Negative for: history of anesthetic complications  Airway Mallampati: I  TM Distance: >3 FB Neck ROM: full    Dental  (+) Edentulous Upper, Edentulous Lower, Upper Dentures, Lower Dentures, Dental Advidsory Given   Pulmonary neg shortness of breath, asthma , neg COPD, neg recent URI,    Pulmonary exam normal        Cardiovascular Exercise Tolerance: Good (-) hypertension(-) angina+ CAD and + Cardiac Stents  (-) Past MI and (-) CABG Normal cardiovascular exam(-) dysrhythmias (-) Valvular Problems/Murmurs     Neuro/Psych negative neurological ROS  negative psych ROS   GI/Hepatic Neg liver ROS, GERD  ,  Endo/Other  diabetes, Insulin Dependent  Renal/GU negative Renal ROS  negative genitourinary   Musculoskeletal   Abdominal   Peds  Hematology negative hematology ROS (+)   Anesthesia Other Findings Past Medical History: No date: Arthritis No date: Asthma No date: Coronary artery disease No date: Diabetes mellitus without complication (HCC) No date: GERD (gastroesophageal reflux disease)   Reproductive/Obstetrics negative OB ROS                             Anesthesia Physical Anesthesia Plan  ASA: II  Anesthesia Plan: General   Post-op Pain Management:    Induction: Intravenous  PONV Risk Score and Plan: 3 and Propofol infusion and TIVA  Airway Management Planned: Natural Airway and Nasal Cannula  Additional Equipment:   Intra-op Plan:   Post-operative Plan:   Informed Consent: I have reviewed the patients History and Physical, chart, labs and discussed the procedure including the risks, benefits and  alternatives for the proposed anesthesia with the patient or authorized representative who has indicated his/her understanding and acceptance.     Dental Advisory Given  Plan Discussed with: Anesthesiologist, CRNA and Surgeon  Anesthesia Plan Comments:         Anesthesia Quick Evaluation

## 2019-01-12 NOTE — Transfer of Care (Signed)
Immediate Anesthesia Transfer of Care Note  Patient: Lindsey Padilla  Procedure(s) Performed: ESOPHAGOGASTRODUODENOSCOPY (EGD) WITH PROPOFOL (N/A )  Patient Location: PACU  Anesthesia Type:General  Level of Consciousness: awake and sedated  Airway & Oxygen Therapy: Patient Spontanous Breathing and Patient connected to nasal cannula oxygen  Post-op Assessment: Report given to RN and Post -op Vital signs reviewed and stable  Post vital signs: Reviewed and stable  Last Vitals:  Vitals Value Taken Time  BP    Temp    Pulse    Resp    SpO2      Last Pain:  Vitals:   01/12/19 0714  TempSrc: Tympanic         Complications: No apparent anesthesia complications

## 2019-01-12 NOTE — H&P (Signed)
Outpatient short stay form Pre-procedure 01/12/2019 7:48 AM Lindsey Deem U  MD  Primary Physician: Marcelino DusterJohn Johnston, MD  Reason for visit: EGD  History of present illness: Patient is a 85 year old female presenting today for an EGD in regards to her history of GERD and an abnormal CT scan of the abdomen.  She had a CT scan because of complaint of abdominal bloating.  She was found to have a lot of constipation and stool on her CT scan.  She has been started on MiraLAX and that has been of some benefit.  However on the CT scan she was found to have some wall thickening of the distal esophagus possibly a collapsed small hiatal hernia and EGD was recommended to exclude a mass.  Patient states that she does not really catch food at times food seems to go down slowly.  She does not regurgitate foods.  She has been on a proton pump inhibitor, pantoprazole.  Review of her labs indicate a slightly elevated TSH.    Current Facility-Administered Medications:  .  0.9 %  sodium chloride infusion, , Intravenous, Continuous, Lindsey Deem,  U, MD .  0.9 %  sodium chloride infusion, , Intravenous, Continuous, Lindsey Deem,  U, MD .  butamben-tetracaine-benzocaine (CETACAINE) 06-21-12 % spray, , , ,   Medications Prior to Admission  Medication Sig Dispense Refill Last Dose  . acetaminophen (RA ACETAMINOPHEN) 650 MG CR tablet Take 2 tablets by mouth 2 (two) times daily. As needed for back pain   Past Week at Unknown time  . Artificial Tear Solution (TEARS NATURALE OP) Apply 1 drop to eye every 4 (four) hours as needed. For dry eye   01/11/2019 at Unknown time  . Ascorbic Acid (VITAMIN C) 100 MG tablet Take 1 tablet by mouth daily.   Past Week at Unknown time  . aspirin 81 MG tablet Take 1 tablet by mouth daily.   01/11/2019 at Unknown time  . Biotin (BIOTIN MAXIMUM STRENGTH) 10 MG TABS Take 1,000 mg by mouth daily.    Past Week at Unknown time  . Calcium Carb-Cholecalciferol (CALCIUM-VITAMIN D3) 600-400 MG-UNIT  CAPS Take 1 tablet by mouth daily.   Past Week at Unknown time  . docusate sodium (COLACE) 50 MG capsule Take by mouth.   Past Week at Unknown time  . estradiol (ESTRACE) 0.1 MG/GM vaginal cream Place vaginally.   Past Week at Unknown time  . fluticasone (FLONASE) 50 MCG/ACT nasal spray Place into both nostrils as needed for allergies or rhinitis.   Past Week at Unknown time  . gabapentin (NEURONTIN) 100 MG capsule Take 200 mg by mouth 2 (two) times daily. 1 capsule in AM and 2 capsules in PM   Past Week at Unknown time  . glucose blood (ONE TOUCH ULTRA TEST) test strip 6 strips daily. Reported on 09/05/2015   01/11/2019 at Unknown time  . glucose blood (ONE TOUCH ULTRA TEST) test strip Test up to 8 times daily   01/11/2019 at Unknown time  . hydroxypropyl methylcellulose / hypromellose (ISOPTO TEARS / GONIOVISC) 2.5 % ophthalmic solution Apply to eye.   01/11/2019 at Unknown time  . insulin lispro (HUMALOG) 100 UNIT/ML injection Inject 70 Units into the skin. Up to 70 units in insulin pump   01/12/2019 at Unknown time  . LACTOBACILLUS PO Take by mouth.   Past Week at Unknown time  . levothyroxine (SYNTHROID, LEVOTHROID) 112 MCG tablet Take 1 tablet by mouth daily.   01/11/2019 at Unknown time  . lisinopril (PRINIVIL,ZESTRIL) 10  MG tablet Take 20 mg by mouth daily.   01/12/2019 at [place]  . magnesium 30 MG tablet Take 500 mg by mouth daily.   Past Week at Unknown time  . Magnesium Gluconate 550 MG TABS Take by mouth.   Past Week at Unknown time  . Magnesium Oxide 500 MG TABS Take by mouth.   Past Week at Unknown time  . metoprolol tartrate (LOPRESSOR) 25 MG tablet Take 25 mg by mouth.   01/11/2019 at Unknown time  . mirabegron ER (MYRBETRIQ) 25 MG TB24 tablet Take 1 tablet (25 mg total) by mouth daily. 30 tablet 0 01/11/2019 at Unknown time  . Multiple Vitamin (MULTIVITAMIN) capsule Take 1 capsule by mouth daily.   Past Week at Unknown time  . oxybutynin (DITROPAN) 5 MG tablet Take 1 tablet (5 mg total) by  mouth 2 (two) times daily. 180 tablet 3 01/11/2019 at Unknown time  . pantoprazole (PROTONIX) 40 MG tablet Take 1 tablet by mouth daily.   01/11/2019 at Unknown time  . pravastatin (PRAVACHOL) 20 MG tablet Take 20 mg by mouth daily.   01/11/2019 at Unknown time  . PROPYLENE GLYCOL OP Apply to eye.   01/11/2019 at Unknown time  . budesonide (PULMICORT FLEXHALER) 180 MCG/ACT inhaler as needed.   prn  . citalopram (CELEXA) 10 MG tablet Take 1 tablet by mouth daily.     . clopidogrel (PLAVIX) 75 MG tablet Take 75 mg by mouth daily.   7/29.20  . metoprolol succinate (TOPROL-XL) 25 MG 24 hr tablet Take 1 tablet by mouth daily.   Not Taking at Unknown time  . montelukast (SINGULAIR) 10 MG tablet Take 1 tablet by mouth daily.        Allergies  Allergen Reactions  . Nitrofurantoin Shortness Of Breath    BS drops, wheezing  . Sulfa Antibiotics Swelling  . Atorvastatin Other (See Comments)    Knee/thigh pain Other reaction(s): Other (See Comments) Knee/thigh pain Knee/thigh pain  . Penicillins Other (See Comments)    Other reaction(s): Other (See Comments) Patient does not remember  . Propoxyphene Other (See Comments)    Other reaction(s): Other (See Comments), Unknown  . Tramadol Other (See Comments) and Nausea And Vomiting    Other reaction(s): Other (See Comments)  . Erythromycin Rash     Past Medical History:  Diagnosis Date  . Arthritis   . Asthma   . Coronary artery disease   . Diabetes mellitus without complication ([place])   . GERD (gastroesophageal reflux disease)     Review of systems:      Physical Exam    Heart and lungs: Regular rate and rhythm without rub or gallop lungs are bilaterally clear    HEENT: Normocephalic atraumatic eyes are anicteric    Other:    Pertinant exam for procedure: Soft nontender nondistended bowel sounds positive normoactive    Planned proceedures: EGD and indicated procedures. I have discussed the risks benefits and complications of  procedures to include not limited to bleeding, infection, perforation and the risk of sedation and the patient wishes to proceed.      Lollie Sails, MD Gastroenterology 01/12/2019  7:48 AM

## 2019-01-12 NOTE — Anesthesia Post-op Follow-up Note (Signed)
Anesthesia QCDR form completed.        

## 2019-01-12 NOTE — Op Note (Signed)
[facility] Gastroenterology Patient Name: Lindsey Padilla Hesch Procedure Date: 01/12/2019 7:37 AM MRN: [id number] Account #: [id number] Date of Birth: November 05, 1932 Admit Type: Outpatient Age: 85 Room: [facility] ENDO ROOM 3 Gender: Female Note Status: Finalized Procedure:            Upper GI endoscopy Indications:          Gastro-esophageal reflux disease, Abnormal CT of the GI                        tract Providers:            Christena DeemMartin U. Rupal Childress, MD Referring MD:         Gracelyn NurseJohn D. Johnston, MD (Referring MD) Medicines:            Monitored Anesthesia Care Complications:        No immediate complications. Procedure:            Pre-Anesthesia Assessment:                       - ASA Grade Assessment: III - A patient with severe                        systemic disease.                       After obtaining informed consent, the endoscope was                        passed under direct vision. Throughout the procedure,                        the patient's blood pressure, pulse, and oxygen                        saturations were monitored continuously. The Endoscope                        was introduced through the mouth, and advanced to the                        third part of duodenum. The upper GI endoscopy was                        accomplished without difficulty. The patient tolerated                        the procedure well. Findings:      The Z-line was variable. Biopsies were taken with a cold forceps for       histology.      There is no evidence of mass or other lesion in the esophagus except as       noted.      A small hiatal hernia was found. The Z-line was a variable distance from       incisors; the hiatal hernia was sliding.      Diffuse and patchy mild inflammation characterized by congestion (edema)       and erythema was found in the gastric body. Biopsies were taken with a       cold forceps for histology.      Patchy moderate inflammation characterized by  congestion (edema) and  erosions was found in the gastric antrum. Biopsies were taken with a       cold forceps for histology.      A single non-bleeding localized erosion was found on the posterior wall       of the gastric antrum. There were no stigmata of recent bleeding.       Biopsies were taken with a cold forceps for histology.      The cardia and gastric fundus were normal on retroflexion otherwise.      The examined duodenum was normal. There is a sharp angulation of the       proximal duodenal c-loop and a small amount of residual food.      A few diminutive non-bleeding erosions were found in the lower third of       the esophagus. Impression:           - Z-line variable. Biopsied.                       - Small hiatal hernia.                       - Gastritis. Biopsied.                       - Erosive gastritis. Biopsied.                       - Gastric erosion without bleeding. Biopsied.                       - Normal examined duodenum. Recommendation:       - Discharge patient to home.                       - Use Protonix (pantoprazole) 40 mg PO daily daily. Procedure Code(s):    --- Professional ---                       [phone], Esophagogastroduodenoscopy, flexible, transoral;                        with biopsy, single or multiple Diagnosis Code(s):    --- Professional ---                       K22.8, Other specified diseases of esophagus                       K44.9, Diaphragmatic hernia without obstruction or                        gangrene                       K29.70, Gastritis, unspecified, without bleeding                       K29.60, Other gastritis without bleeding                       K25.9, Gastric ulcer, unspecified as acute or chronic,                        without hemorrhage or perforation  K21.9, Gastro-esophageal reflux disease without                        esophagitis                       R93.3, Abnormal findings on diagnostic  imaging of other                        parts of digestive tract CPT copyright 2019 [employer]. All rights reserved. The codes documented in this report are preliminary and upon coder review may  be revised to meet current compliance requirements. Lollie Sails, MD 01/12/2019 8:22:55 AM This report has been signed electronically. Number of Addenda: 0 Note Initiated On: 01/12/2019 7:37 AM      [facility]

## 2019-01-13 ENCOUNTER — Encounter: Payer: Self-pay | Admitting: Gastroenterology

## 2019-01-14 LAB — SURGICAL PATHOLOGY

## 2019-01-18 NOTE — Anesthesia Postprocedure Evaluation (Signed)
Anesthesia Post Note  Patient: Lindsey Padilla  Procedure(s) Performed: ESOPHAGOGASTRODUODENOSCOPY (EGD) WITH PROPOFOL (N/A )  Patient location during evaluation: PACU Anesthesia Type: General Level of consciousness: awake and alert Pain management: pain level controlled Vital Signs Assessment: post-procedure vital signs reviewed and stable Respiratory status: spontaneous breathing, nonlabored ventilation, respiratory function stable and patient connected to nasal cannula oxygen Cardiovascular status: blood pressure returned to baseline and stable Postop Assessment: no apparent nausea or vomiting Anesthetic complications: no     Last Vitals:  Vitals:   01/12/19 0820 01/12/19 0840  BP: (!) 125/46 (!) 151/126  Pulse: 80 64  Resp: 17 (!) 23  Temp:    SpO2: 99% 100%    Last Pain:  Vitals:   01/12/19 0810  TempSrc: Tympanic                 Molli Barrows

## 2019-02-17 ENCOUNTER — Other Ambulatory Visit: Payer: Self-pay | Admitting: Infectious Diseases

## 2019-02-17 ENCOUNTER — Other Ambulatory Visit: Payer: Self-pay | Admitting: Internal Medicine

## 2019-02-17 DIAGNOSIS — Z1231 Encounter for screening mammogram for malignant neoplasm of breast: Secondary | ICD-10-CM

## 2019-03-08 ENCOUNTER — Ambulatory Visit
Admission: RE | Admit: 2019-03-08 | Discharge: 2019-03-08 | Disposition: A | Discharge status: Home / Self Care | Payer: Medicare Other | Source: Ambulatory Visit | Attending: Infectious Diseases | Admitting: Infectious Diseases

## 2019-03-08 ENCOUNTER — Other Ambulatory Visit: Payer: Self-pay

## 2019-03-08 DIAGNOSIS — Z1231 Encounter for screening mammogram for malignant neoplasm of breast: Secondary | ICD-10-CM

## 2019-05-28 ENCOUNTER — Ambulatory Visit: Payer: Medicare Other | Admitting: Urology

## 2019-05-28 ENCOUNTER — Encounter: Payer: Self-pay | Admitting: Urology

## 2020-02-16 ENCOUNTER — Other Ambulatory Visit: Payer: Self-pay | Admitting: Infectious Diseases

## 2020-02-16 DIAGNOSIS — Z1231 Encounter for screening mammogram for malignant neoplasm of breast: Secondary | ICD-10-CM

## 2020-03-08 ENCOUNTER — Ambulatory Visit
Admission: RE | Admit: 2020-03-08 | Discharge: 2020-03-08 | Disposition: A | Discharge status: Home / Self Care | Payer: Medicare Other | Source: Ambulatory Visit | Attending: Infectious Diseases | Admitting: Infectious Diseases

## 2020-03-08 ENCOUNTER — Other Ambulatory Visit: Payer: Self-pay

## 2020-03-08 DIAGNOSIS — Z1231 Encounter for screening mammogram for malignant neoplasm of breast: Secondary | ICD-10-CM | POA: Insufficient documentation

## 2020-03-08 IMAGING — MG DIGITAL SCREENING BILAT W/ TOMO W/ CAD
6 of 10 series · 6 of 30 positions shown · non-contrast
Comparison: Previous exam(s).

CLINICAL DATA: Screening.

EXAM:
DIGITAL SCREENING BILATERAL MAMMOGRAM WITH TOMO AND CAD

[R MLO synth-2D (1 of 2)]
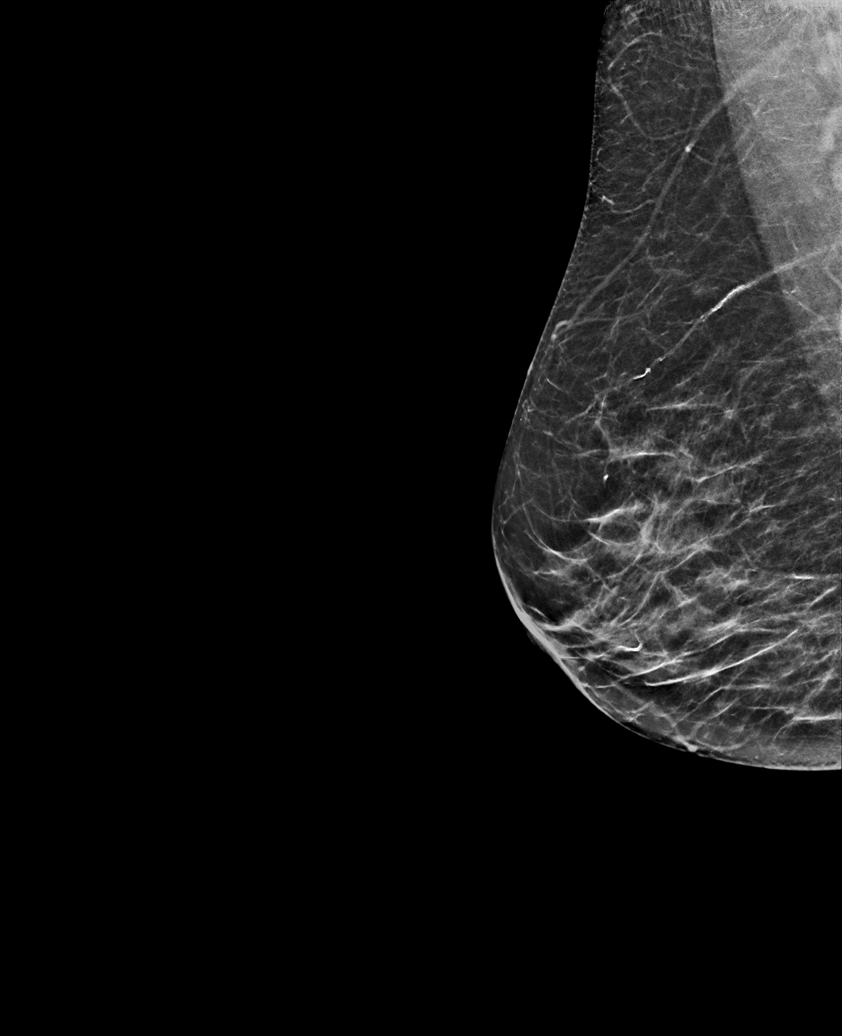

[R MLO synth-2D (2 of 2)]
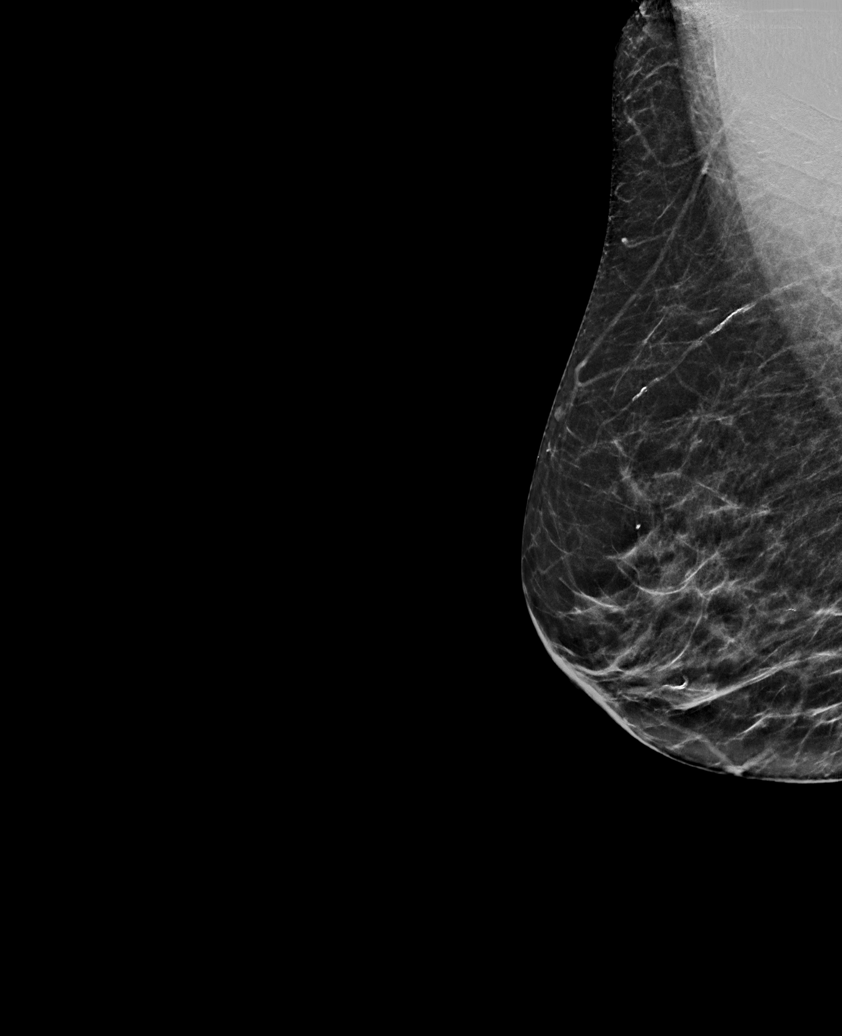

[L MLO synth-2D]
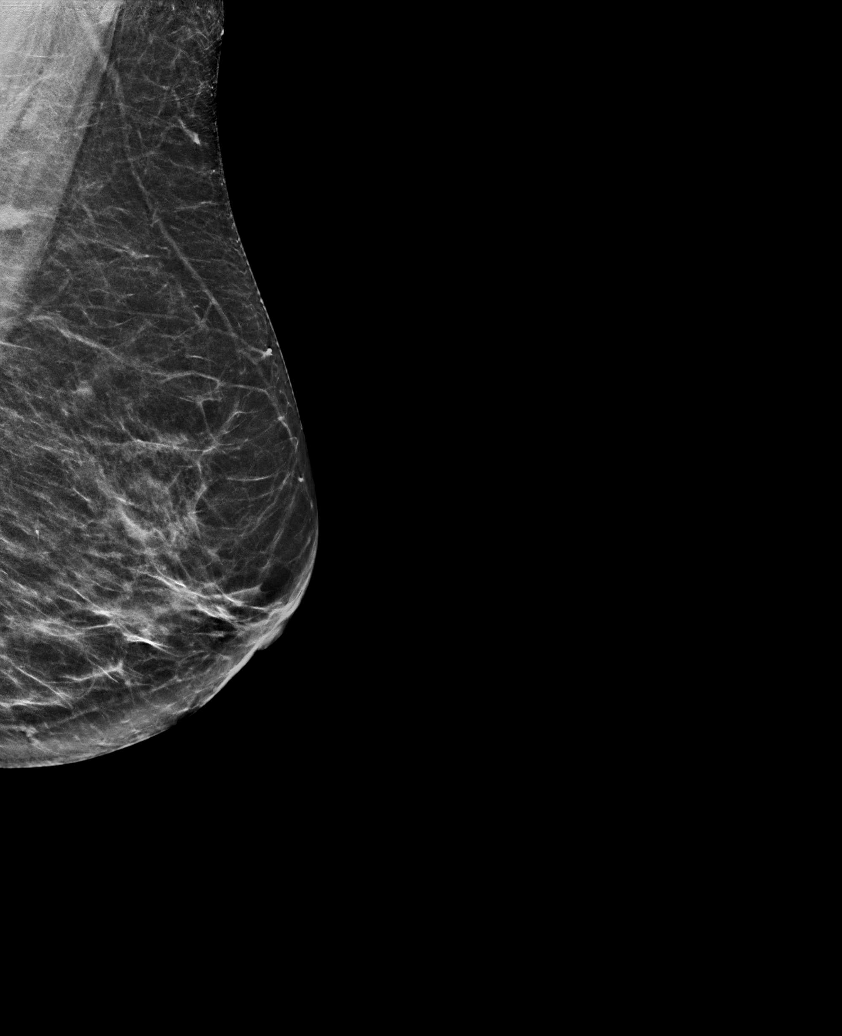

[L CC synth-2D]
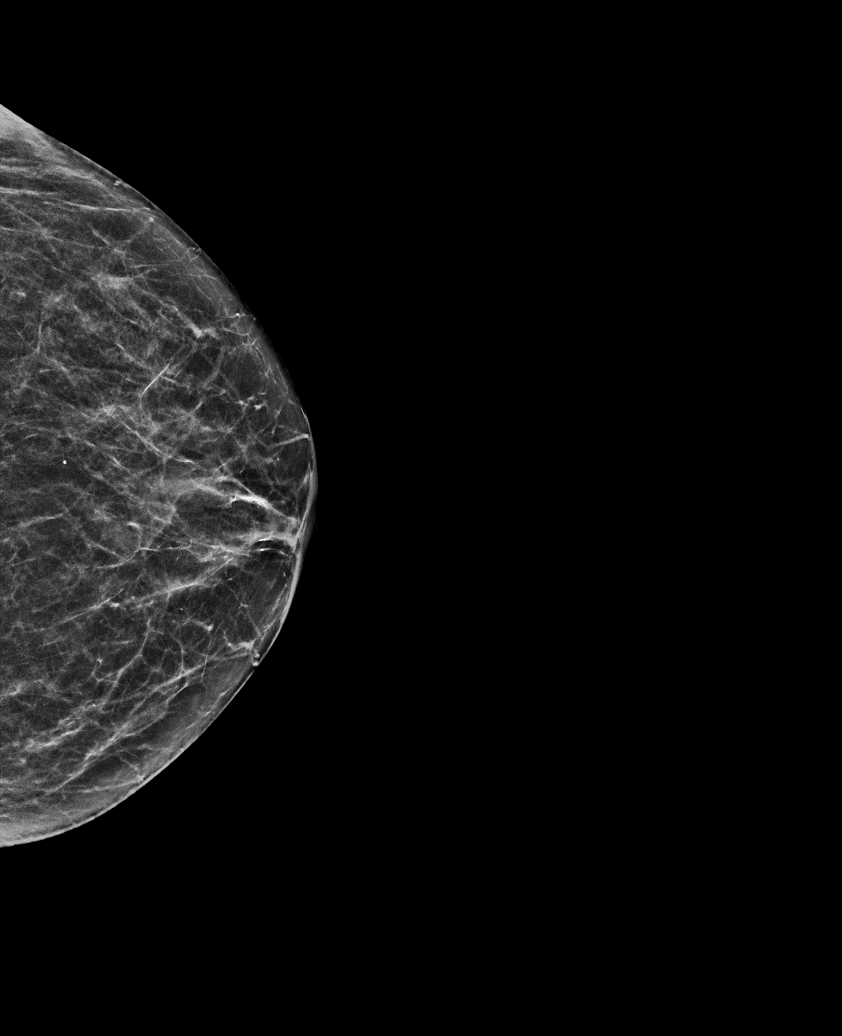

[R CC synth-2D]
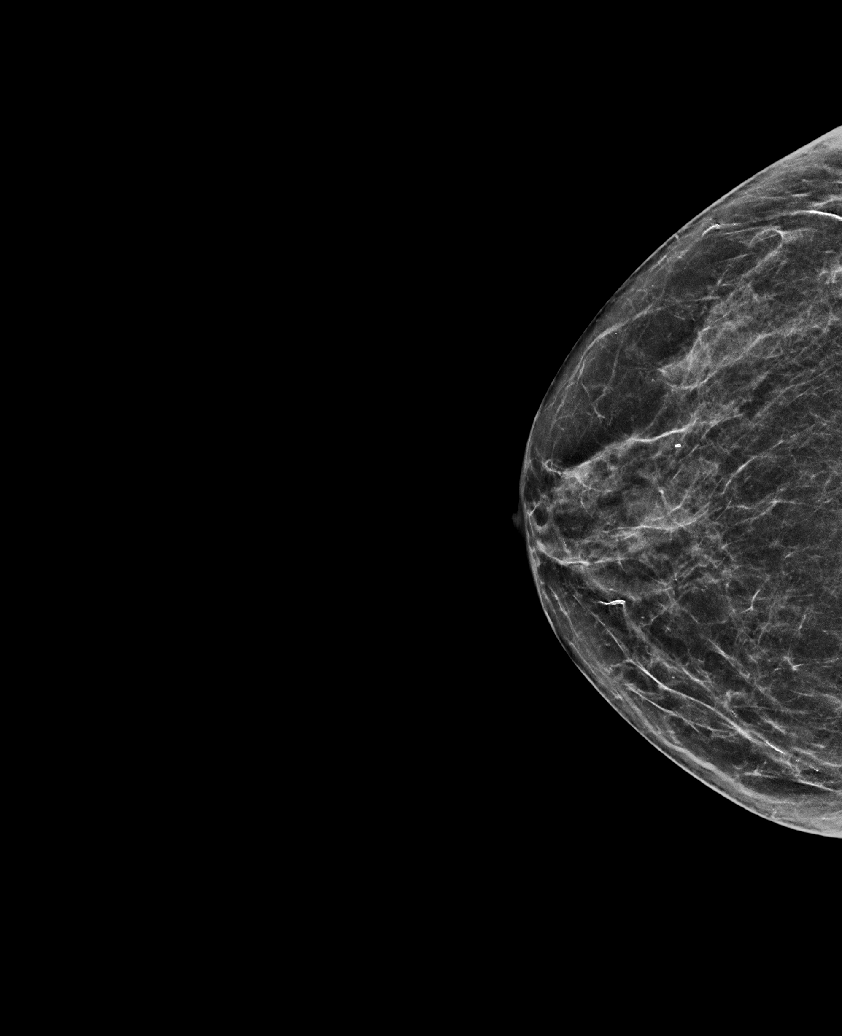

[R MLO tomo · tomo slice 31/61.0]
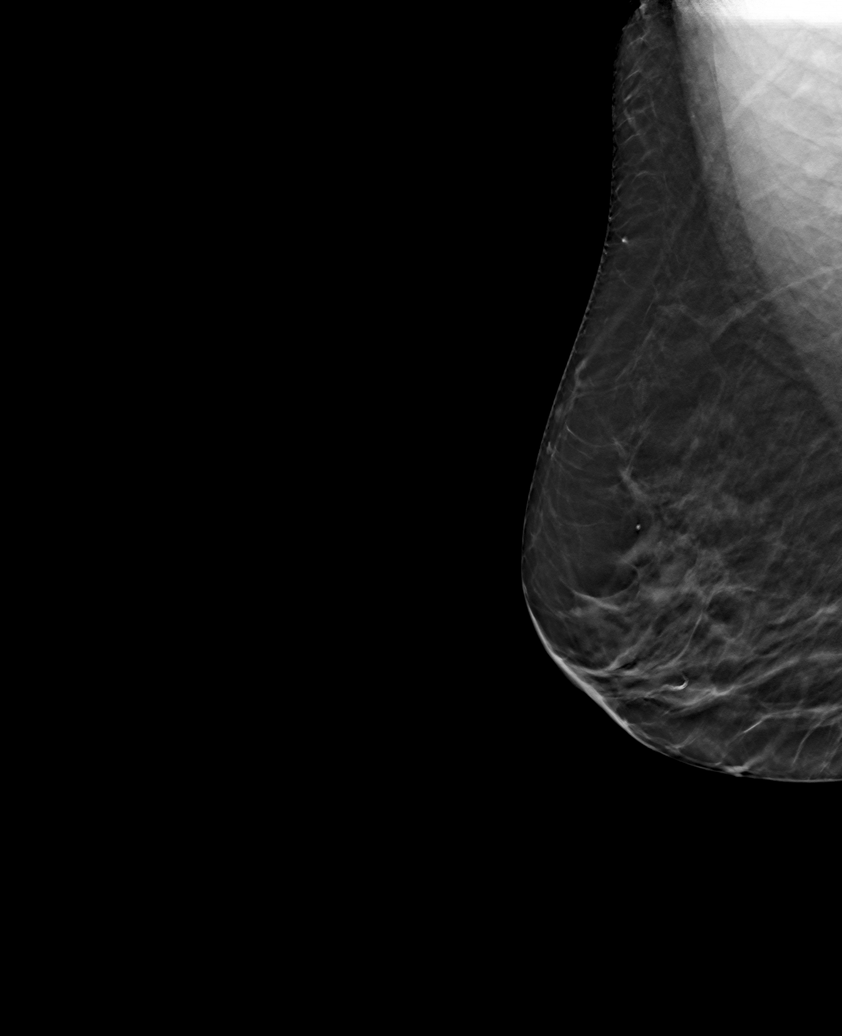

[6 of 30 positions shown; findings below may reference images not displayed]

ACR Breast Density Category b: There are scattered areas of
fibroglandular density.
FINDINGS: There are no findings suspicious for malignancy. Images were
processed with CAD.
IMPRESSION: No mammographic evidence of malignancy. A result letter of this
screening mammogram will be mailed directly to the patient.

RECOMMENDATION:
Screening mammogram in one year. (Code:[TQ])

BI-RADS CATEGORY  1: Negative.

## 2020-10-03 LAB — HM DIABETES EYE EXAM

## 2020-10-05 ENCOUNTER — Encounter: Payer: Self-pay | Admitting: Endocrinology

## 2021-03-01 ENCOUNTER — Other Ambulatory Visit: Payer: Self-pay | Admitting: Infectious Diseases

## 2021-03-01 DIAGNOSIS — Z1231 Encounter for screening mammogram for malignant neoplasm of breast: Secondary | ICD-10-CM

## 2021-03-22 ENCOUNTER — Other Ambulatory Visit: Payer: Self-pay

## 2021-03-22 ENCOUNTER — Ambulatory Visit
Admission: RE | Admit: 2021-03-22 | Discharge: 2021-03-22 | Disposition: A | Discharge status: Home / Self Care | Payer: Medicare Other | Source: Ambulatory Visit | Attending: Infectious Diseases | Admitting: Infectious Diseases

## 2021-03-22 DIAGNOSIS — Z1231 Encounter for screening mammogram for malignant neoplasm of breast: Secondary | ICD-10-CM | POA: Insufficient documentation

## 2021-08-11 ENCOUNTER — Ambulatory Visit (INDEPENDENT_AMBULATORY_CARE_PROVIDER_SITE_OTHER): Payer: Medicare Other

## 2021-08-11 ENCOUNTER — Ambulatory Visit
Admission: EM | Admit: 2021-08-11 | Discharge: 2021-08-11 | Disposition: A | Discharge status: Home / Self Care | Payer: Medicare Other | Attending: Emergency Medicine | Admitting: Emergency Medicine

## 2021-08-11 ENCOUNTER — Encounter: Payer: Self-pay | Admitting: Emergency Medicine

## 2021-08-11 ENCOUNTER — Other Ambulatory Visit: Payer: Self-pay

## 2021-08-11 DIAGNOSIS — R051 Acute cough: Secondary | ICD-10-CM | POA: Diagnosis present

## 2021-08-11 DIAGNOSIS — I10 Essential (primary) hypertension: Secondary | ICD-10-CM | POA: Diagnosis not present

## 2021-08-11 DIAGNOSIS — J189 Pneumonia, unspecified organism: Secondary | ICD-10-CM | POA: Insufficient documentation

## 2021-08-11 DIAGNOSIS — Z792 Long term (current) use of antibiotics: Secondary | ICD-10-CM | POA: Diagnosis not present

## 2021-08-11 DIAGNOSIS — R059 Cough, unspecified: Secondary | ICD-10-CM

## 2021-08-11 DIAGNOSIS — I2510 Atherosclerotic heart disease of native coronary artery without angina pectoris: Secondary | ICD-10-CM | POA: Insufficient documentation

## 2021-08-11 DIAGNOSIS — Z794 Long term (current) use of insulin: Secondary | ICD-10-CM | POA: Insufficient documentation

## 2021-08-11 DIAGNOSIS — Z20822 Contact with and (suspected) exposure to covid-19: Secondary | ICD-10-CM | POA: Insufficient documentation

## 2021-08-11 DIAGNOSIS — Z79899 Other long term (current) drug therapy: Secondary | ICD-10-CM | POA: Insufficient documentation

## 2021-08-11 DIAGNOSIS — E109 Type 1 diabetes mellitus without complications: Secondary | ICD-10-CM | POA: Insufficient documentation

## 2021-08-11 LAB — LIPASE, BLOOD: Lipase: 26 U/L (ref 11–51)

## 2021-08-11 LAB — CBC WITH DIFFERENTIAL/PLATELET
Abs Immature Granulocytes: 0.1 10*3/uL — ABNORMAL HIGH (ref 0.00–0.07)
Basophils Absolute: 0.1 10*3/uL (ref 0.0–0.1)
Basophils Relative: 0 %
Eosinophils Absolute: 0.2 10*3/uL (ref 0.0–0.5)
Eosinophils Relative: 1 %
HCT: 34.6 % — ABNORMAL LOW (ref 36.0–46.0)
Hemoglobin: 11.3 g/dL — ABNORMAL LOW (ref 12.0–15.0)
Immature Granulocytes: 1 %
Lymphocytes Relative: 13 %
Lymphs Abs: 2.1 10*3/uL (ref 0.7–4.0)
MCH: 31.4 pg (ref 26.0–34.0)
MCHC: 32.7 g/dL (ref 30.0–36.0)
MCV: 96.1 fL (ref 80.0–100.0)
Monocytes Absolute: 1.5 10*3/uL — ABNORMAL HIGH (ref 0.1–1.0)
Monocytes Relative: 9 %
Neutro Abs: 12.1 10*3/uL — ABNORMAL HIGH (ref 1.7–7.7)
Neutrophils Relative %: 76 %
Platelets: 200 10*3/uL (ref 150–400)
RBC: 3.6 MIL/uL — ABNORMAL LOW (ref 3.87–5.11)
RDW: 12.9 % (ref 11.5–15.5)
WBC: 16 10*3/uL — ABNORMAL HIGH (ref 4.0–10.5)
nRBC: 0 % (ref 0.0–0.2)

## 2021-08-11 LAB — URINALYSIS, COMPLETE (UACMP) WITH MICROSCOPIC
Bilirubin Urine: NEGATIVE
Glucose, UA: NEGATIVE mg/dL
Hgb urine dipstick: NEGATIVE
Leukocytes,Ua: NEGATIVE
Nitrite: NEGATIVE
Protein, ur: 30 mg/dL — AB
Specific Gravity, Urine: 1.02 (ref 1.005–1.030)
pH: 5.5 (ref 5.0–8.0)

## 2021-08-11 LAB — COMPREHENSIVE METABOLIC PANEL
ALT: 9 U/L (ref 0–44)
AST: 28 U/L (ref 15–41)
Albumin: 3.9 g/dL (ref 3.5–5.0)
Alkaline Phosphatase: 58 U/L (ref 38–126)
Anion gap: 8 (ref 5–15)
BUN: 22 mg/dL (ref 8–23)
CO2: 25 mmol/L (ref 22–32)
Calcium: 9.6 mg/dL (ref 8.9–10.3)
Chloride: 100 mmol/L (ref 98–111)
Creatinine, Ser: 0.98 mg/dL (ref 0.44–1.00)
GFR, Estimated: 56 mL/min — ABNORMAL LOW (ref >60)
Glucose, Bld: 177 mg/dL — ABNORMAL HIGH (ref 70–99)
Potassium: 4.8 mmol/L (ref 3.5–5.1)
Sodium: 133 mmol/L — ABNORMAL LOW (ref 135–145)
Total Bilirubin: 0.5 mg/dL (ref 0.3–1.2)
Total Protein: 7.2 g/dL (ref 6.5–8.1)

## 2021-08-11 LAB — RESP PANEL BY RT-PCR (FLU A&B, COVID) ARPGX2
Influenza A by PCR: NEGATIVE
Influenza B by PCR: NEGATIVE
SARS Coronavirus 2 by RT PCR: NEGATIVE

## 2021-08-11 MED ORDER — DOXYCYCLINE HYCLATE 100 MG PO TABS: 100.0000 mg | ORAL_TABLET | Freq: Two times a day (BID) | ORAL | 0 refills | Status: DC

## 2021-08-11 MED ORDER — BENZONATATE 100 MG PO CAPS: 100.0000 mg | ORAL_CAPSULE | Freq: Three times a day (TID) | ORAL | 0 refills | Status: DC

## 2021-08-11 MED ORDER — PSEUDOEPH-BROMPHEN-DM 30-2-10 MG/5ML PO SYRP: 10.0000 mL | ORAL_SOLUTION | Freq: Four times a day (QID) | ORAL | 0 refills | Status: DC | PRN

## 2021-08-11 NOTE — ED Provider Notes (Signed)
? ?[facility] ?Provider Note ? ?Patient Contact: 1:03 PM (approximate) ? ? ?History  ? ?Cough and Headache ? ? ?HPI ? ?Lindsey Padilla is a 88 y.o. female with a history of coronary artery disease, type 1 diabetes, lumbar radiculopathy and essential hypertension presents to the urgent care with cough, nasal congestion, rhinorrhea and some generalized malaise at home for the past 2 to 3 days.  Patient reports that she had difficulty sleeping through the night last night due to cough.  She denies chest pain, chest tightness and shortness of breath.  She has been afebrile. ? ?  ? ? ?Physical Exam  ? ?Triage Vital Signs: ?ED Triage Vitals [08/11/21 1253]  ?Enc Vitals Group  ?   BP 123/61  ?   Pulse Rate 75  ?   Resp   ?   Temp 97.9 ?F (36.6 ?C)  ?   Temp Source Oral  ?   SpO2 100 %  ?   Weight   ?   Height   ?   Head Circumference   ?   Peak Flow   ?   Pain Score   ?   Pain Loc   ?   Pain Edu?   ?   Excl. in GC?   ? ? ?Most recent vital signs: ?Vitals:  ? 08/11/21 1253  ?BP: 123/61  ?Pulse: 75  ?Temp: 97.9 ?F (36.6 ?C)  ?SpO2: 100%  ? ? ? ?Constitutional: Alert and oriented. Patient is lying supine. ?Eyes: Conjunctivae are normal. PERRL. EOMI. ?Head: Atraumatic. ?ENT: ?     Ears: Tympanic membranes are mildly injected with mild effusion bilaterally.  ?     Nose: No congestion/rhinnorhea. ?     Mouth/Throat: Mucous membranes are moist. Posterior pharynx is mildly erythematous.  ?Hematological/Lymphatic/Immunilogical: No cervical lymphadenopathy.  ?Cardiovascular: Normal rate, regular rhythm. Normal S1 and S2.  Good peripheral circulation. ?Respiratory: Normal respiratory effort without tachypnea or retractions. Lungs CTAB. Good air entry to the bases with no decreased or absent breath sounds. ?Gastrointestinal: Bowel sounds ?4 quadrants. Soft and nontender to palpation. No guarding or rigidity. No palpable masses. No distention. No CVA tenderness. ?Musculoskeletal: Full range of motion to all  extremities. No gross deformities appreciated. ?Neurologic:  Normal speech and language. No gross focal neurologic deficits are appreciated.  ?Skin:  Skin is warm, dry and intact. No rash noted. ?Psychiatric: Mood and affect are normal. Speech and behavior are normal. Patient exhibits appropriate insight and judgement. ? ? ?ED Results / Procedures / Treatments  ? ?Labs ?(all labs ordered are listed, but only abnormal results are displayed) ?Labs Reviewed  ?RESP PANEL BY RT-PCR (FLU A&B, COVID) ARPGX2  ?CBC WITH DIFFERENTIAL/PLATELET  ?COMPREHENSIVE METABOLIC PANEL  ?LIPASE, BLOOD  ?URINALYSIS, COMPLETE (UACMP) WITH MICROSCOPIC  ? ? ? ? ? ? ?RADIOLOGY ? ?I personally viewed and evaluated these images as part of my medical decision making, as well as reviewing the written report by the radiologist. ? ?ED Provider Interpretation:  ? ? ?PROCEDURES: ? ?Critical Care performed: No ? ?Procedures ? ? ?MEDICATIONS ORDERED IN ED: ?Medications - No data to display ? ? ?IMPRESSION / MDM / ASSESSMENT AND PLAN / ED COURSE  ?I reviewed the triage vital signs and the nursing notes. ?             ?               ? ?Differential diagnosis includes, but is not limited to, viral URI, COVID-19,  community-acquired pneumonia, bronchitis... ? ?Assessment and plan ?Cough ?88 year old female with past medical history detailed above presents to the urgent care with cough, body aches nasal congestion for the past 3 days. ? ?Vital signs are reassuring at triage.  On physical exam, patient was alert, active and nontoxic-appearing with no increased work of breathing.  She had no adventitious lung sounds auscultated. ? ?We will obtain basic labs and chest x-ray and will reassess.  Patient care was turned over to colleague Christiane Ha Cuthriell at shift change. ? ?  ? ? ?FINAL CLINICAL IMPRESSION(S) / ED DIAGNOSES  ? ?Final diagnoses:  ?Acute cough  ? ? ? ?Rx / DC Orders  ? ?ED Discharge Orders   ? ? None  ? ?  ? ? ? ?Note:  This document was prepared  using Dragon voice recognition software and may include unintentional dictation errors. ?  ?Orvil Feil, PA-C ?08/11/21 1307 ? ?

## 2021-08-11 NOTE — ED Triage Notes (Signed)
Pt c/o productive cough with green phlegm onset last night and headache since Wednesday. Pt states she has been taking Gabapentin and Tylenol for headache. Taking generic Zyrtec.  ?

## 2021-08-11 NOTE — ED Provider Notes (Signed)
----------------------------------------- ?  2:59 PM on 08/11/2021 ?----------------------------------------- ? ?Blood pressure 123/61, pulse 75, temperature 97.9 ?F (36.6 ?C), temperature source Oral, SpO2 100 %. ? ?Assuming care from [facility], PA-C  In short, Lindsey Padilla is a 88 y.o. female with a chief complaint of Cough and Headache ?Marland Kitchen  Refer to the original H&P for additional details. ? ?The current plan of care is to await patient's x-ray, labs.  Patient has reassuring urine, negative flu and COVID panel, reassuring CMP, lipase.  Chest x-ray without acute consolidation at this time.  Patient did have an elevation in her white blood cell count to 16,000 however she has no fever here or at home.  Vital signs are otherwise stable.  At this time I suspect a viral illness but given the patient's age, white blood cell count I will cover the patient for pneumonia with an antibiotic.  Additional symptom control medications for cough will be prescribed.  Have given strict follow-up precautions to return to the urgent care or proceed to the emergency department for any worsening shortness of breath, fevers, worsening malaise, weakness altered mental status.  Patient is agreeable with this plan and stable for discharge at this time. ? ?Urgent CARE diagnosis: ? ?Community-acquired pneumonia ? ?  ?Racheal Patches, PA-C ?08/11/21 1502 ? ?

## 2021-11-24 ENCOUNTER — Encounter: Payer: Self-pay | Admitting: Emergency Medicine

## 2021-11-24 ENCOUNTER — Ambulatory Visit
Admission: EM | Admit: 2021-11-24 | Discharge: 2021-11-24 | Disposition: A | Discharge status: Home / Self Care | Payer: Medicare Other | Attending: Physician Assistant | Admitting: Physician Assistant

## 2021-11-24 DIAGNOSIS — N3001 Acute cystitis with hematuria: Secondary | ICD-10-CM | POA: Insufficient documentation

## 2021-11-24 LAB — URINALYSIS, MICROSCOPIC (REFLEX): WBC, UA: >50 WBC/hpf (ref 0–5)

## 2021-11-24 LAB — URINALYSIS, ROUTINE W REFLEX MICROSCOPIC
Bilirubin Urine: NEGATIVE
Glucose, UA: NEGATIVE mg/dL
Nitrite: NEGATIVE
Protein, ur: 30 mg/dL — AB
Specific Gravity, Urine: 1.01 (ref 1.005–1.030)
pH: 5 (ref 5.0–8.0)

## 2021-11-24 MED ORDER — CEPHALEXIN 500 MG PO CAPS: 500.0000 mg | ORAL_CAPSULE | Freq: Two times a day (BID) | ORAL | 0 refills | Status: AC

## 2021-11-24 NOTE — ED Triage Notes (Signed)
Patient c/o burning when urinating and lower back pain that started this morning.  

## 2021-11-24 NOTE — Discharge Instructions (Signed)
Your urinalysis shows Lindsey Padilla blood cells and bacteria seen in the microscope which are indicative of infection, your urine will be sent to the lab to determine exactly which bacteria is present, if any changes need to be made to your medications you will be notified  Begin use of Keflex every morning and every evening for 5 days  You may use over-the-counter Pyridium to help minimize your symptoms until antibiotic removes bacteria, this medication will turn your urine orange  Increase your fluid intake through use of water  As always practice good hygiene, wiping front to back and avoidance of scented vaginal products to prevent further irritation  If symptoms continue to persist after use of medication or recur please follow-up with urgent care or your primary doctor as needed

## 2021-11-24 NOTE — ED Provider Notes (Signed)
[facility]    CSN: [id number] Arrival date & time: 11/24/21  1501      History   Chief Complaint Chief Complaint  Patient presents with   Dysuria    HPI Lindsey Padilla is a 88 y.o. female.   Patient presents with dysuria, urinary frequency, vaginal irritation and centralized lower back pain beginning this morning.  Has not attempted treatment of symptoms.  Denies hematuria, lower abdominal pain or pressure, fever, chills, vaginal discharge or itching.    Past Medical History:  Diagnosis Date   Arthritis    Asthma    Coronary artery disease    Diabetes mellitus without complication (HCC)    GERD (gastroesophageal reflux disease)     Patient Active Problem List   Diagnosis Date Noted   Urge incontinence 05/26/2018    Past Surgical History:  Procedure Laterality Date   ABDOMINAL HYSTERECTOMY     APPENDECTOMY     BREAST CYST ASPIRATION Right    neg   CHOLECYSTECTOMY     ESOPHAGOGASTRODUODENOSCOPY (EGD) WITH PROPOFOL N/A 01/12/2019   Procedure: ESOPHAGOGASTRODUODENOSCOPY (EGD) WITH PROPOFOL;  Surgeon: Christena Deem, MD;  Location: [facility] ENDOSCOPY;  Service: Endoscopy;  Laterality: N/A;   FRACTURE SURGERY     JOINT REPLACEMENT Left 05/2015    OB History   No obstetric history on file.      Home Medications    Prior to Admission medications   Medication Sig Start Date End Date Taking? Authorizing Provider  aspirin 81 MG tablet Take 1 tablet by mouth daily. 12/01/09  Yes [provider]  metoprolol succinate (TOPROL-XL) 25 MG 24 hr tablet Take 1 tablet by mouth daily. 08/09/15  Yes [provider]  acetaminophen (RA ACETAMINOPHEN) 650 MG CR tablet Take 2 tablets by mouth 2 (two) times daily. As needed for back pain 08/31/09   [provider]  Artificial Tear Solution (TEARS NATURALE OP) Apply 1 drop to eye every 4 (four) hours as needed. For dry eye    [provider]  Ascorbic Acid (VITAMIN C) 100 MG tablet  Take 1 tablet by mouth daily. 02/20/09   [provider]  benzonatate (TESSALON) 100 MG capsule Take 1 capsule (100 mg total) by mouth every 8 (eight) hours. 08/11/21   Cuthriell, Delorise Royals, PA-C  Biotin (BIOTIN MAXIMUM STRENGTH) 10 MG TABS Take 1,000 mg by mouth daily.     [provider]  brompheniramine-pseudoephedrine-DM 30-2-10 MG/5ML syrup Take 10 mLs by mouth 4 (four) times daily as needed. 08/11/21   Cuthriell, Delorise Royals, PA-C  budesonide (PULMICORT FLEXHALER) 180 MCG/ACT inhaler as needed. 05/22/12   [provider]  Calcium Carb-Cholecalciferol (CALCIUM-VITAMIN D3) 600-400 MG-UNIT CAPS Take 1 tablet by mouth daily. 02/20/09   [provider]  carbidopa-levodopa (SINEMET IR) 25-100 MG tablet TAKE 2 TABS IN THE MORNING, AND ONE TAB IN THE AFTERNOON, AND ONE TAB IN THE EVENING 03/14/21   [provider]  citalopram (CELEXA) 10 MG tablet Take 1 tablet by mouth daily. 12/01/14 12/01/15  [provider]  clopidogrel (PLAVIX) 75 MG tablet Take 75 mg by mouth daily. 05/22/12   [provider]  docusate sodium (COLACE) 50 MG capsule Take by mouth.    [provider]  doxycycline (VIBRA-TABS) 100 MG tablet Take 1 tablet (100 mg total) by mouth 2 (two) times daily. 08/11/21   Cuthriell, Delorise Royals, PA-C  estradiol (ESTRACE) 0.1 MG/GM vaginal cream Place vaginally. 12/14/15   [provider]  fluticasone Aleda Grana)  50 MCG/ACT nasal spray Place into both nostrils as needed for allergies or rhinitis.    [provider]  gabapentin (NEURONTIN) 100 MG capsule Take 200 mg by mouth 2 (two) times daily. 1 capsule in AM and 2 capsules in PM    [provider]  glucose blood (ONE TOUCH ULTRA TEST) test strip 6 strips daily. Reported on 09/05/2015 12/01/09   [provider]  glucose blood (ONE TOUCH ULTRA TEST) test strip Test up to 8 times daily 05/23/15   [provider]  hydroxypropyl methylcellulose /  hypromellose (ISOPTO TEARS / GONIOVISC) 2.5 % ophthalmic solution Apply to eye.    [provider]  insulin lispro (HUMALOG) 100 UNIT/ML injection Inject 70 Units into the skin. Up to 70 units in insulin pump 09/04/15   [provider]  LACTOBACILLUS PO Take by mouth.    [provider]  levothyroxine (SYNTHROID, LEVOTHROID) 112 MCG tablet Take 1 tablet by mouth daily. 12/01/14   [provider]  lisinopril (PRINIVIL,ZESTRIL) 10 MG tablet Take 20 mg by mouth daily. 05/22/12   [provider]  magnesium 30 MG tablet Take 500 mg by mouth daily.    [provider]  Magnesium Gluconate 550 MG TABS Take by mouth.    [provider]  Magnesium Oxide 500 MG TABS Take by mouth.    [provider]  metoprolol tartrate (LOPRESSOR) 25 MG tablet Take 25 mg by mouth. 05/22/12   [provider]  mirabegron ER (MYRBETRIQ) 25 MG TB24 tablet Take 1 tablet (25 mg total) by mouth daily. 08/20/18   Stoioff, Verna CzechScott C, MD  montelukast (SINGULAIR) 10 MG tablet Take 1 tablet by mouth daily. 04/24/15 04/23/16  [provider]  Multiple Vitamin (MULTIVITAMIN) capsule Take 1 capsule by mouth daily. 02/20/09   [provider]  oxybutynin (DITROPAN) 5 MG tablet Take 1 tablet (5 mg total) by mouth 2 (two) times daily. 10/20/18   Stoioff, Verna CzechScott C, MD  pantoprazole (PROTONIX) 40 MG tablet Take 1 tablet by mouth daily. 05/05/14   [provider]  pravastatin (PRAVACHOL) 20 MG tablet Take 20 mg by mouth daily. 05/21/12   [provider]  PROPYLENE GLYCOL OP Apply to eye.    [provider]  spironolactone (ALDACTONE) 25 MG tablet Take by mouth. 07/05/21 07/05/22  [provider]    Family History Family History  Problem Relation Age of Onset   Breast cancer Paternal Aunt     Social History Social History   Tobacco Use   Smoking status: Never   Smokeless tobacco: Never  Vaping Use   Vaping Use: Never  used  Substance Use Topics   Alcohol use: No    Alcohol/week: 0.0 standard drinks of alcohol   Drug use: Never     Allergies   Nitrofurantoin, Sulfa antibiotics, Atorvastatin, Penicillins, Propoxyphene, Tramadol, and Erythromycin   Review of Systems Review of Systems  Constitutional: Negative.   Respiratory: Negative.    Cardiovascular: Negative.   Genitourinary:  Positive for dysuria and frequency. Negative for decreased urine volume, difficulty urinating, dyspareunia, enuresis, flank pain, genital sores, hematuria, menstrual problem, pelvic pain, urgency, vaginal bleeding, vaginal discharge and vaginal pain.  Skin: Negative.   Neurological: Negative.      Physical Exam Triage Vital Signs ED Triage Vitals  Enc Vitals Group     BP 11/24/21 1530 (!) 171/68     Pulse Rate 11/24/21 1530 63     Resp 11/24/21 1530 15  Temp 11/24/21 1530 98 F (36.7 C)     Temp Source 11/24/21 1530 Oral     SpO2 11/24/21 1530 100 %     Weight 11/24/21 1525 145 lb (65.8 kg)     Height 11/24/21 1525 5\' 6"  (1.676 m)     Head Circumference --      Peak Flow --      Pain Score 11/24/21 1524 6     Pain Loc --      Pain Edu? --      Excl. in GC? --    No data found.  Updated Vital Signs BP (!) 171/68 (BP Location: Left Arm)   Pulse 63   Temp 98 F (36.7 C) (Oral)   Resp 15   Ht 5\' 6"  (1.676 m)   Wt 145 lb (65.8 kg)   SpO2 100%   BMI 23.40 kg/m   Visual Acuity Right Eye Distance:   Left Eye Distance:   Bilateral Distance:    Right Eye Near:   Left Eye Near:    Bilateral Near:     Physical Exam Constitutional:      Appearance: Normal appearance.  Eyes:     Extraocular Movements: Extraocular movements intact.  Pulmonary:     Effort: Pulmonary effort is normal.  Abdominal:     General: Abdomen is flat. Bowel sounds are normal. There is no distension.     Palpations: Abdomen is soft.     Tenderness: There is no right CVA tenderness or left CVA tenderness.  Neurological:      Mental Status: She is alert and oriented to person, place, and time. Mental status is at baseline.  Psychiatric:        Mood and Affect: Mood normal.        Behavior: Behavior normal.      UC Treatments / Results  Labs (all labs ordered are listed, but only abnormal results are displayed) Labs Reviewed  URINALYSIS, ROUTINE W REFLEX MICROSCOPIC    EKG   Radiology No results found.  Procedures Procedures (including critical care time)  Medications Ordered in UC Medications - No data to display  Initial Impression / Assessment and Plan / UC Course  I have reviewed the triage vital signs and the nursing notes.  Pertinent labs & imaging results that were available during my care of the patient were reviewed by me and considered in my medical decision making (see chart for details).  Acute cystitis with hematuria  Urinalysis showing Lindsey Padilla blood cells and bacteria in the microscope, sent for culture, discussed findings with patient, Keflex prescribed for outpatient management analgesics increase fluid intake and good hygiene for additional supportive care, given strict precautions that if symptoms persist or worsen she is to follow-up with urgent care or PCP for further evaluation and management Final Clinical Impressions(s) / UC Diagnoses   Final diagnoses:  None   Discharge Instructions   None    ED Prescriptions   None    PDMP not reviewed this encounter.   01/25/22, 11/24/21 [phone]

## 2021-11-27 LAB — URINE CULTURE: Culture: 30000 — AB

## 2022-01-04 ENCOUNTER — Inpatient Hospital Stay
Admission: EM | Admit: 2022-01-04 | Discharge: 2022-01-09 | DRG: 639 | Disposition: A | Discharge status: Home / Self Care | Payer: Medicare Other | Attending: Internal Medicine | Admitting: Internal Medicine

## 2022-01-04 ENCOUNTER — Other Ambulatory Visit: Payer: Self-pay

## 2022-01-04 ENCOUNTER — Emergency Department: Payer: Medicare Other

## 2022-01-04 DIAGNOSIS — Z803 Family history of malignant neoplasm of breast: Secondary | ICD-10-CM | POA: Diagnosis not present

## 2022-01-04 DIAGNOSIS — Z66 Do not resuscitate: Secondary | ICD-10-CM | POA: Diagnosis present

## 2022-01-04 DIAGNOSIS — E860 Dehydration: Secondary | ICD-10-CM | POA: Diagnosis present

## 2022-01-04 DIAGNOSIS — R197 Diarrhea, unspecified: Principal | ICD-10-CM

## 2022-01-04 DIAGNOSIS — K529 Noninfective gastroenteritis and colitis, unspecified: Secondary | ICD-10-CM | POA: Diagnosis present

## 2022-01-04 DIAGNOSIS — E1010 Type 1 diabetes mellitus with ketoacidosis without coma: Principal | ICD-10-CM | POA: Diagnosis present

## 2022-01-04 DIAGNOSIS — R7889 Finding of other specified substances, not normally found in blood: Secondary | ICD-10-CM

## 2022-01-04 DIAGNOSIS — Z880 Allergy status to penicillin: Secondary | ICD-10-CM

## 2022-01-04 DIAGNOSIS — R112 Nausea with vomiting, unspecified: Principal | ICD-10-CM

## 2022-01-04 DIAGNOSIS — Z9861 Coronary angioplasty status: Secondary | ICD-10-CM

## 2022-01-04 DIAGNOSIS — Z885 Allergy status to narcotic agent status: Secondary | ICD-10-CM

## 2022-01-04 DIAGNOSIS — Z90710 Acquired absence of both cervix and uterus: Secondary | ICD-10-CM

## 2022-01-04 DIAGNOSIS — Z9641 Presence of insulin pump (external) (internal): Secondary | ICD-10-CM | POA: Diagnosis present

## 2022-01-04 DIAGNOSIS — Z888 Allergy status to other drugs, medicaments and biological substances status: Secondary | ICD-10-CM | POA: Diagnosis not present

## 2022-01-04 DIAGNOSIS — E10649 Type 1 diabetes mellitus with hypoglycemia without coma: Secondary | ICD-10-CM | POA: Diagnosis not present

## 2022-01-04 DIAGNOSIS — K5909 Other constipation: Secondary | ICD-10-CM | POA: Diagnosis present

## 2022-01-04 DIAGNOSIS — K219 Gastro-esophageal reflux disease without esophagitis: Secondary | ICD-10-CM | POA: Diagnosis present

## 2022-01-04 DIAGNOSIS — G20 Parkinson's disease: Secondary | ICD-10-CM | POA: Diagnosis present

## 2022-01-04 DIAGNOSIS — K523 Indeterminate colitis: Secondary | ICD-10-CM | POA: Diagnosis present

## 2022-01-04 DIAGNOSIS — Z794 Long term (current) use of insulin: Secondary | ICD-10-CM | POA: Diagnosis not present

## 2022-01-04 DIAGNOSIS — J45909 Unspecified asthma, uncomplicated: Secondary | ICD-10-CM | POA: Diagnosis present

## 2022-01-04 DIAGNOSIS — I10 Essential (primary) hypertension: Secondary | ICD-10-CM | POA: Diagnosis present

## 2022-01-04 DIAGNOSIS — E785 Hyperlipidemia, unspecified: Secondary | ICD-10-CM | POA: Diagnosis present

## 2022-01-04 DIAGNOSIS — Z9049 Acquired absence of other specified parts of digestive tract: Secondary | ICD-10-CM

## 2022-01-04 DIAGNOSIS — I2510 Atherosclerotic heart disease of native coronary artery without angina pectoris: Secondary | ICD-10-CM | POA: Diagnosis present

## 2022-01-04 DIAGNOSIS — Z881 Allergy status to other antibiotic agents status: Secondary | ICD-10-CM | POA: Diagnosis not present

## 2022-01-04 LAB — URINALYSIS, ROUTINE W REFLEX MICROSCOPIC
Bacteria, UA: NONE SEEN
Bilirubin Urine: NEGATIVE
Glucose, UA: 50 mg/dL — AB
Hgb urine dipstick: NEGATIVE
Ketones, ur: 20 mg/dL — AB
Leukocytes,Ua: NEGATIVE
Nitrite: NEGATIVE
Protein, ur: 30 mg/dL — AB
Specific Gravity, Urine: 1.031 — ABNORMAL HIGH (ref 1.005–1.030)
pH: 5 (ref 5.0–8.0)

## 2022-01-04 LAB — CBC
HCT: 37.7 % (ref 36.0–46.0)
Hemoglobin: 11.7 g/dL — ABNORMAL LOW (ref 12.0–15.0)
MCH: 29.9 pg (ref 26.0–34.0)
MCHC: 31 g/dL (ref 30.0–36.0)
MCV: 96.4 fL (ref 80.0–100.0)
Platelets: 177 10*3/uL (ref 150–400)
RBC: 3.91 MIL/uL (ref 3.87–5.11)
RDW: 12.9 % (ref 11.5–15.5)
WBC: 8.2 10*3/uL (ref 4.0–10.5)
nRBC: 0 % (ref 0.0–0.2)

## 2022-01-04 LAB — CBC WITH DIFFERENTIAL/PLATELET
Abs Immature Granulocytes: 0.03 10*3/uL (ref 0.00–0.07)
Basophils Absolute: 0 10*3/uL (ref 0.0–0.1)
Basophils Relative: 0 %
Eosinophils Absolute: 0 10*3/uL (ref 0.0–0.5)
Eosinophils Relative: 0 %
HCT: 32.9 % — ABNORMAL LOW (ref 36.0–46.0)
Hemoglobin: 10.5 g/dL — ABNORMAL LOW (ref 12.0–15.0)
Immature Granulocytes: 0 %
Lymphocytes Relative: 28 %
Lymphs Abs: 2.4 10*3/uL (ref 0.7–4.0)
MCH: 30.7 pg (ref 26.0–34.0)
MCHC: 31.9 g/dL (ref 30.0–36.0)
MCV: 96.2 fL (ref 80.0–100.0)
Monocytes Absolute: 0.9 10*3/uL (ref 0.1–1.0)
Monocytes Relative: 11 %
Neutro Abs: 5 10*3/uL (ref 1.7–7.7)
Neutrophils Relative %: 61 %
Platelets: 159 10*3/uL (ref 150–400)
RBC: 3.42 MIL/uL — ABNORMAL LOW (ref 3.87–5.11)
RDW: 12.9 % (ref 11.5–15.5)
WBC: 8.3 10*3/uL (ref 4.0–10.5)
nRBC: 0 % (ref 0.0–0.2)

## 2022-01-04 LAB — COMPREHENSIVE METABOLIC PANEL
ALT: 27 U/L (ref 0–44)
AST: 35 U/L (ref 15–41)
Albumin: 4.2 g/dL (ref 3.5–5.0)
Alkaline Phosphatase: 45 U/L (ref 38–126)
Anion gap: 10 (ref 5–15)
BUN: 20 mg/dL (ref 8–23)
CO2: 24 mmol/L (ref 22–32)
Calcium: 9.4 mg/dL (ref 8.9–10.3)
Chloride: 103 mmol/L (ref 98–111)
Creatinine, Ser: 0.98 mg/dL (ref 0.44–1.00)
GFR, Estimated: 56 mL/min — ABNORMAL LOW (ref >60)
Glucose, Bld: 192 mg/dL — ABNORMAL HIGH (ref 70–99)
Potassium: 3.9 mmol/L (ref 3.5–5.1)
Sodium: 137 mmol/L (ref 135–145)
Total Bilirubin: 0.6 mg/dL (ref 0.3–1.2)
Total Protein: 7.1 g/dL (ref 6.5–8.1)

## 2022-01-04 LAB — BLOOD GAS, VENOUS
Acid-Base Excess: 2.8 mmol/L — ABNORMAL HIGH (ref 0.0–2.0)
Bicarbonate: 29.4 mmol/L — ABNORMAL HIGH (ref 20.0–28.0)
O2 Saturation: 60.6 %
Patient temperature: 37
pCO2, Ven: 52 mmHg (ref 44–60)
pH, Ven: 7.36 (ref 7.25–7.43)
pO2, Ven: 38 mmHg (ref 32–45)

## 2022-01-04 LAB — BASIC METABOLIC PANEL
Anion gap: 11 (ref 5–15)
BUN: 18 mg/dL (ref 8–23)
CO2: 24 mmol/L (ref 22–32)
Calcium: 8.7 mg/dL — ABNORMAL LOW (ref 8.9–10.3)
Chloride: 102 mmol/L (ref 98–111)
Creatinine, Ser: 0.81 mg/dL (ref 0.44–1.00)
GFR, Estimated: >60 mL/min (ref >60)
Glucose, Bld: 135 mg/dL — ABNORMAL HIGH (ref 70–99)
Potassium: 3.9 mmol/L (ref 3.5–5.1)
Sodium: 137 mmol/L (ref 135–145)

## 2022-01-04 LAB — CBG MONITORING, ED
Glucose-Capillary: 144 mg/dL — ABNORMAL HIGH (ref 70–99)
Glucose-Capillary: 191 mg/dL — ABNORMAL HIGH (ref 70–99)
Glucose-Capillary: 215 mg/dL — ABNORMAL HIGH (ref 70–99)

## 2022-01-04 LAB — LIPASE, BLOOD: Lipase: 20 U/L (ref 11–51)

## 2022-01-04 LAB — MAGNESIUM: Magnesium: 1.9 mg/dL (ref 1.7–2.4)

## 2022-01-04 LAB — TROPONIN I (HIGH SENSITIVITY)
Troponin I (High Sensitivity): 35 ng/L — ABNORMAL HIGH (ref <18)
Troponin I (High Sensitivity): 50 ng/L — ABNORMAL HIGH (ref <18)

## 2022-01-04 LAB — PHOSPHORUS: Phosphorus: 2.8 mg/dL (ref 2.5–4.6)

## 2022-01-04 LAB — BETA-HYDROXYBUTYRIC ACID
Beta-Hydroxybutyric Acid: 1.62 mmol/L — ABNORMAL HIGH (ref 0.05–0.27)
Beta-Hydroxybutyric Acid: 1.32 mmol/L — ABNORMAL HIGH (ref 0.05–0.27)

## 2022-01-04 LAB — LACTIC ACID, PLASMA: Lactic Acid, Venous: 1.7 mmol/L (ref 0.5–1.9)

## 2022-01-04 MED ORDER — METRONIDAZOLE 500 MG/100ML IV SOLN
500.0000 mg | Freq: Three times a day (TID) | INTRAVENOUS | Status: DC
  Administered 2022-01-04 – 2022-01-09 (×15): 500 mg via INTRAVENOUS
  Filled 2022-01-04 (×16): qty 100

## 2022-01-04 MED ORDER — GABAPENTIN 100 MG PO CAPS
100.0000 mg | ORAL_CAPSULE | Freq: Every day | ORAL | Status: DC
Start: 2022-01-05 — End: 2022-01-09
  Administered 2022-01-05 – 2022-01-09 (×5): 100 mg via ORAL
  Filled 2022-01-04 (×5): qty 1

## 2022-01-04 MED ORDER — ONDANSETRON HCL 4 MG/2ML IJ SOLN
4.0000 mg | Freq: Once | INTRAMUSCULAR | Status: AC
  Administered 2022-01-04: 4 mg via INTRAVENOUS
  Filled 2022-01-04: qty 2

## 2022-01-04 MED ORDER — SODIUM CHLORIDE 0.9 % IV BOLUS
1000.0000 mL | Freq: Once | INTRAVENOUS | Status: AC
  Administered 2022-01-04: 1000 mL via INTRAVENOUS

## 2022-01-04 MED ORDER — ASPIRIN 81 MG PO TBEC
81.0000 mg | DELAYED_RELEASE_TABLET | Freq: Every day | ORAL | Status: DC
  Administered 2022-01-05 – 2022-01-09 (×5): 81 mg via ORAL
  Filled 2022-01-04 (×6): qty 1

## 2022-01-04 MED ORDER — DEXTROSE 50 % IV SOLN: 0.0000 mL | INTRAVENOUS | Status: DC | PRN

## 2022-01-04 MED ORDER — METOPROLOL SUCCINATE ER 25 MG PO TB24
25.0000 mg | ORAL_TABLET | Freq: Every day | ORAL | Status: DC
  Administered 2022-01-04 – 2022-01-09 (×6): 25 mg via ORAL
  Filled 2022-01-04 (×6): qty 1

## 2022-01-04 MED ORDER — LACTATED RINGERS IV BOLUS
20.0000 mL/kg | Freq: Once | INTRAVENOUS | Status: AC
  Administered 2022-01-04: 1298 mL via INTRAVENOUS

## 2022-01-04 MED ORDER — IOHEXOL 300 MG/ML  SOLN
100.0000 mL | Freq: Once | INTRAMUSCULAR | Status: AC | PRN
  Administered 2022-01-04: 100 mL via INTRAVENOUS

## 2022-01-04 MED ORDER — CITALOPRAM HYDROBROMIDE 20 MG PO TABS
10.0000 mg | ORAL_TABLET | Freq: Every day | ORAL | Status: DC
  Administered 2022-01-05 – 2022-01-09 (×5): 10 mg via ORAL
  Filled 2022-01-04 (×6): qty 1

## 2022-01-04 MED ORDER — CARBIDOPA-LEVODOPA 25-100 MG PO TABS: 2.0000 | ORAL_TABLET | Freq: Every day | ORAL | Status: DC

## 2022-01-04 MED ORDER — CLOPIDOGREL BISULFATE 75 MG PO TABS: 75.0000 mg | ORAL_TABLET | Freq: Every day | ORAL | Status: DC

## 2022-01-04 MED ORDER — CARBIDOPA-LEVODOPA 25-100 MG PO TABS
1.0000 | ORAL_TABLET | Freq: Every day | ORAL | Status: DC
Start: 2022-01-05 — End: 2022-01-04

## 2022-01-04 MED ORDER — CARBIDOPA-LEVODOPA 25-100 MG PO TABS: 1.0000 | ORAL_TABLET | Freq: Every day | ORAL | Status: DC

## 2022-01-04 MED ORDER — POTASSIUM CHLORIDE 10 MEQ/100ML IV SOLN
10.0000 meq | INTRAVENOUS | Status: AC
  Administered 2022-01-04 (×2): 10 meq via INTRAVENOUS
  Filled 2022-01-04 (×2): qty 100

## 2022-01-04 MED ORDER — INSULIN REGULAR(HUMAN) IN NACL 100-0.9 UT/100ML-% IV SOLN
INTRAVENOUS | Status: DC
  Administered 2022-01-04: 1.6 [IU]/h via INTRAVENOUS
  Filled 2022-01-04: qty 100

## 2022-01-04 MED ORDER — GABAPENTIN 100 MG PO CAPS
200.0000 mg | ORAL_CAPSULE | Freq: Every day | ORAL | Status: DC
  Administered 2022-01-04 – 2022-01-08 (×5): 200 mg via ORAL
  Filled 2022-01-04 (×5): qty 2

## 2022-01-04 MED ORDER — DEXTROSE IN LACTATED RINGERS 5 % IV SOLN: INTRAVENOUS | Status: DC

## 2022-01-04 MED ORDER — LEVOFLOXACIN IN D5W 750 MG/150ML IV SOLN
750.0000 mg | INTRAVENOUS | Status: DC
  Administered 2022-01-04 – 2022-01-08 (×3): 750 mg via INTRAVENOUS
  Filled 2022-01-04 (×3): qty 150

## 2022-01-04 MED ORDER — LABETALOL HCL 5 MG/ML IV SOLN
5.0000 mg | Freq: Once | INTRAVENOUS | Status: AC
  Administered 2022-01-04: 5 mg via INTRAVENOUS
  Filled 2022-01-04: qty 4

## 2022-01-04 MED ORDER — PANTOPRAZOLE SODIUM 40 MG PO TBEC: 40.0000 mg | DELAYED_RELEASE_TABLET | Freq: Every day | ORAL | Status: DC

## 2022-01-04 MED ORDER — LEVOTHYROXINE SODIUM 112 MCG PO TABS: 112.0000 ug | ORAL_TABLET | Freq: Every day | ORAL | Status: DC

## 2022-01-04 MED ORDER — BUDESONIDE 180 MCG/ACT IN AEPB
1.0000 | INHALATION_SPRAY | Freq: Two times a day (BID) | RESPIRATORY_TRACT | Status: DC
Start: 2022-01-04 — End: 2022-01-04

## 2022-01-04 MED ORDER — LEVOTHYROXINE SODIUM 100 MCG PO TABS
100.0000 ug | ORAL_TABLET | Freq: Every day | ORAL | Status: DC
  Administered 2022-01-05 – 2022-01-09 (×5): 100 ug via ORAL
  Filled 2022-01-04 (×5): qty 1

## 2022-01-04 MED ORDER — ONDANSETRON 4 MG PO TBDP
4.0000 mg | ORAL_TABLET | Freq: Once | ORAL | Status: AC
  Administered 2022-01-04: 4 mg via ORAL
  Filled 2022-01-04: qty 1

## 2022-01-04 NOTE — H&P (Signed)
History and Physical    Patient: Lindsey Padilla YTK:[id number] DOB: 06-24-1932 DOA: 01/04/2022 DOS: the patient was seen and examined on 01/04/2022 PCP: Mick Sell, MD  Patient coming from: Home  Chief Complaint:  Chief Complaint  Patient presents with   Emesis   HPI: Lindsey Padilla is a 88 y.o. female with medical history significant of DMI, Parkinsons, CAD, S/P PCI, Hyperlipidemia, Hypertension, chronic constipation.  The patient presented with complaints of Diarrhea since Tuesday followed by nausea and vomiting on Wenesday. She states that she hasn't been able to keep anything down since Tuesday including her usual medications other than insulin in pump. The diarrhea has not been bloody.   She denies fevers or chills. No hematemsis, coffee ground emesis, hematochezia, or melena.   In the ED the patient was found to be in mild DKA with a beta hydroxybutyric acid of 1.65, and ketones in urine. PH is normal. Glucose was 190.  CT of the abdomen and pelvis demonstrated mild thickening of the descending and sigmoid colon wall suspicious for infectious of inflammatory colitis. Lab work demonstrated a troponin of 35, Creatinine of 0.98. and hemoglobin of 11.7. There is no increased WBC.  Review of Systems: As mentioned in the history of present illness. All other systems reviewed and are negative. Past Medical History:  Diagnosis Date   Arthritis    Asthma    Coronary artery disease    Diabetes mellitus without complication (HCC)    GERD (gastroesophageal reflux disease)    Past Surgical History:  Procedure Laterality Date   ABDOMINAL HYSTERECTOMY     APPENDECTOMY     BREAST CYST ASPIRATION Right    neg   CHOLECYSTECTOMY     ESOPHAGOGASTRODUODENOSCOPY (EGD) WITH PROPOFOL N/A 01/12/2019   Procedure: ESOPHAGOGASTRODUODENOSCOPY (EGD) WITH PROPOFOL;  Surgeon: Christena Deem, MD;  Location: [facility] ENDOSCOPY;  Service: Endoscopy;  Laterality: N/A;   FRACTURE SURGERY      JOINT REPLACEMENT Left 05/2015   Social History:  reports that she has never smoked. She has never used smokeless tobacco. She reports that she does not drink alcohol and does not use drugs.  Allergies  Allergen Reactions   Nitrofurantoin Shortness Of Breath    BS drops, wheezing   Sulfa Antibiotics Swelling   Atorvastatin Other (See Comments)    Knee/thigh pain Other reaction(s): Other (See Comments) Knee/thigh pain Knee/thigh pain   Penicillins Other (See Comments)    Other reaction(s): Other (See Comments) Patient does not remember   Propoxyphene Other (See Comments)    Other reaction(s): Other (See Comments), Unknown   Tramadol Other (See Comments) and Nausea And Vomiting    Other reaction(s): Other (See Comments)   Erythromycin Rash    Family History  Problem Relation Age of Onset   Breast cancer Paternal Aunt     Prior to Admission medications   Medication Sig Start Date End Date Taking? Authorizing Provider  acetaminophen (RA ACETAMINOPHEN) 650 MG CR tablet Take 2 tablets by mouth 2 (two) times daily. As needed for back pain 08/31/09   [provider]  Artificial Tear Solution (TEARS NATURALE OP) Apply 1 drop to eye every 4 (four) hours as needed. For dry eye    [provider]  Ascorbic Acid (VITAMIN C) 100 MG tablet Take 1 tablet by mouth daily. 02/20/09   [provider]  aspirin 81 MG tablet Take 1 tablet by mouth daily. 12/01/09   [provider]  benzonatate (TESSALON) 100 MG capsule Take  1 capsule (100 mg total) by mouth every 8 (eight) hours. 08/11/21   Cuthriell, Charline Bills, PA-C  Biotin (BIOTIN MAXIMUM STRENGTH) 10 MG TABS Take 1,000 mg by mouth daily.     [provider]  brompheniramine-pseudoephedrine-DM 30-2-10 MG/5ML syrup Take 10 mLs by mouth 4 (four) times daily as needed. 08/11/21   Cuthriell, Charline Bills, PA-C  budesonide (PULMICORT FLEXHALER) 180 MCG/ACT inhaler as needed. 05/22/12   [provider]   Calcium Carb-Cholecalciferol (CALCIUM-VITAMIN D3) 600-400 MG-UNIT CAPS Take 1 tablet by mouth daily. 02/20/09   [provider]  carbidopa-levodopa (SINEMET IR) 25-100 MG tablet TAKE 2 TABS IN THE MORNING, AND ONE TAB IN THE AFTERNOON, AND ONE TAB IN THE EVENING 03/14/21   [provider]  citalopram (CELEXA) 10 MG tablet Take 1 tablet by mouth daily. 12/01/14 12/01/15  [provider]  clopidogrel (PLAVIX) 75 MG tablet Take 75 mg by mouth daily. 05/22/12   [provider]  docusate sodium (COLACE) 50 MG capsule Take by mouth.    [provider]  doxycycline (VIBRA-TABS) 100 MG tablet Take 1 tablet (100 mg total) by mouth 2 (two) times daily. 08/11/21   Cuthriell, Charline Bills, PA-C  estradiol (ESTRACE) 0.1 MG/GM vaginal cream Place vaginally. 12/14/15   [provider]  fluticasone (FLONASE) 50 MCG/ACT nasal spray Place into both nostrils as needed for allergies or rhinitis.    [provider]  gabapentin (NEURONTIN) 100 MG capsule Take 200 mg by mouth 2 (two) times daily. 1 capsule in AM and 2 capsules in PM    [provider]  glucose blood (ONE TOUCH ULTRA TEST) test strip 6 strips daily. Reported on 09/05/2015 12/01/09   [provider]  glucose blood (ONE TOUCH ULTRA TEST) test strip Test up to 8 times daily 05/23/15   [provider]  hydroxypropyl methylcellulose / hypromellose (ISOPTO TEARS / GONIOVISC) 2.5 % ophthalmic solution Apply to eye.    [provider]  insulin lispro (HUMALOG) 100 UNIT/ML injection Inject 70 Units into the skin. Up to 70 units in insulin pump 09/04/15   [provider]  LACTOBACILLUS PO Take by mouth.    [provider]  levothyroxine (SYNTHROID, LEVOTHROID) 112 MCG tablet Take 1 tablet by mouth daily. 12/01/14   [provider]  lisinopril (PRINIVIL,ZESTRIL) 10 MG tablet Take 20 mg by mouth daily. 05/22/12   [provider]  magnesium 30 MG  tablet Take 500 mg by mouth daily.    [provider]  Magnesium Gluconate 550 MG TABS Take by mouth.    [provider]  Magnesium Oxide 500 MG TABS Take by mouth.    [provider]  metoprolol succinate (TOPROL-XL) 25 MG 24 hr tablet Take 1 tablet by mouth daily. 08/09/15   [provider]  metoprolol tartrate (LOPRESSOR) 25 MG tablet Take 25 mg by mouth. 05/22/12   [provider]  mirabegron ER (MYRBETRIQ) 25 MG TB24 tablet Take 1 tablet (25 mg total) by mouth daily. 08/20/18   Stoioff, Ronda Fairly, MD  montelukast (SINGULAIR) 10 MG tablet Take 1 tablet by mouth daily. 04/24/15 04/23/16  [provider]  Multiple Vitamin (MULTIVITAMIN) capsule Take 1 capsule by mouth daily. 02/20/09   [provider]  oxybutynin (DITROPAN) 5 MG tablet Take 1 tablet (5 mg total) by mouth 2 (two) times daily. 10/20/18   Stoioff, Ronda Fairly, MD  pantoprazole (PROTONIX) 40 MG tablet Take 1 tablet by mouth daily. 05/05/14   [provider]  pravastatin (PRAVACHOL) 20 MG tablet Take 20 mg by mouth daily. 05/21/12   [provider]  PROPYLENE GLYCOL OP Apply to eye.    [provider]  spironolactone (ALDACTONE) 25 MG tablet Take by mouth. 07/05/21 07/05/22  [provider]    Physical Exam: Vitals:   01/04/22 1159 01/04/22 1200 01/04/22 1500 01/04/22 1624  BP: (!) 163/84  (!) 162/77 (!) 182/92  Pulse: 82  80 (!) 111  Resp: 18  16 16   Temp: 97.7 F (36.5 C)   98.6 F (37 C)  TempSrc: Oral   Oral  SpO2: 97%  99% 99%  Weight:  64.9 kg    Height:  5\' 3"  (1.6 m)     Exam:  Constitutional:  The patient is awake, alert, and oriented x 3. No acute distress. Eyes:  pupils and irises appear normal Normal lids and conjunctivae ENMT:  grossly normal hearing  Lips appear normal external ears, nose appear normal Oropharynx: mucosa, tongue,posterior pharynx appear normal Neck:  neck appears normal, no masses, normal ROM,  supple no thyromegaly Respiratory:  No increased work of breathing. No wheezes, rales, or rhonchi No tactile fremitus Cardiovascular:  Regular rate and rhythm No murmurs, ectopy, or gallups. No lateral PMI. No thrills. Abdomen:  Abdomen is soft, there was tenderness in left lower quadrant, and non-distended No hernias, masses, or organomegaly Hypoactive bowel sounds.  Musculoskeletal:  No cyanosis, clubbing, or edema Skin:  No rashes, lesions, ulcers palpation of skin: no induration or nodules Neurologic:  CN 2-12 intact Sensation all 4 extremities intact Psychiatric:  Mental status Mood, affect appropriate Orientation to person, place, time  judgment and insight appear intact  Data Reviewed:  CBC, CMP,  EKG CT abdomen and pelvis.   Assessment and Plan: Assessment and Plan: CAD S/P percutaneous coronary angioplasty Continue home doses of Plavix, ASA, and metoprolol. Monitor on telemetry. Troponin 35 in ED. No complaints of chest pain.  Parkinson disease (HCC) Continue sinemet as at home.  Colitis, indeterminate Mild inflammation of the colonic mucosa in descending colon and sigmoid colon. With no elevation of WBC, and no fever, findings are concerning for ischemic colitis. The patient will be given IV fluids and IV antibiotics.   DKA, type 1 (HCC) Mild, likely due to colitis. The patient will be placed on Endotool with D5 1/2 NS IV fluid to correct DKA which is primarily due to insulin deficiency with minor hyperglycemia. Insulin pump will be turned off.    I have seen and examined this patient myself. I have spent 78 minutes in her evaluation and care.  CODE STATUS: Full code  Consults: None  Family Communication: Family at bedside  Severity of Illness: The appropriate patient status for this patient is OBSERVATION. Observation status is judged to be reasonable and necessary in order to provide the required intensity of service to ensure the patient's  safety. The patient's presenting symptoms, physical exam findings, and initial radiographic and laboratory data in the context of their medical condition is felt to place them at decreased risk for further clinical deterioration. Furthermore, it is anticipated that the patient will be medically stable for discharge from the hospital within 2 midnights of admission.   Author: Quintin Hjort, DO 01/04/2022 6:17 PM  For on call review [email or web address] .

## 2022-01-04 NOTE — Assessment & Plan Note (Signed)
Continue home doses of Plavix, ASA, and metoprolol. Monitor on telemetry. Troponin 35 in ED. No complaints of chest pain.

## 2022-01-04 NOTE — ED Provider Notes (Signed)
[facility] Provider Note    Event Date/Time   First MD Initiated Contact with Patient 01/04/22 1354     (approximate)   History   Emesis   HPI  Lindsey Padilla is a 88 y.o. female who reports nausea vomiting and diarrhea since Tuesday today is Friday.  She really has not had anything eat or drink since Tuesday.  She has not been able to keep anything down.  She does an insulin pump but has been unable to tolerate any of her other medicines.  She has a little achiness in her belly but nothing when she would call pain.  She has not had any fever.  She has not had any blood in the vomit or the diarrhea.  She has not had any diarrhea today.  Complains of feeling weak.      Physical Exam   Triage Vital Signs: ED Triage Vitals  Enc Vitals Group     BP 01/04/22 1159 (!) 163/84     Pulse Rate 01/04/22 1159 82     Resp 01/04/22 1159 18     Temp 01/04/22 1159 97.7 F (36.5 C)     Temp Source 01/04/22 1159 Oral     SpO2 01/04/22 1159 97 %     Weight 01/04/22 1200 143 lb (64.9 kg)     Height 01/04/22 1200 5\' 3"  (1.6 m)     Head Circumference --      Peak Flow --      Pain Score 01/04/22 1200 0     Pain Loc --      Pain Edu? --      Excl. in GC? --     Most recent vital signs: Vitals:   01/04/22 1500 01/04/22 1624  BP: (!) 162/77 (!) 182/92  Pulse: 80 (!) 111  Resp: 16 16  Temp:  98.6 F (37 C)  SpO2: 99% 99%     General: Awake, no distress.  Patient smells slightly ketotic CV:  Good peripheral perfusion.  Heart regular rate and rhythm no audible murmurs Resp:  Normal effort. \Lungs are clear Abd:  No distention.  Soft patient reports it aches a little bit when I push on it but there is no organomegaly or focal tenderness bowel sounds are positive Extremities show no edema   ED Results / Procedures / Treatments   Labs (all labs ordered are listed, but only abnormal results are displayed) Labs Reviewed  COMPREHENSIVE METABOLIC PANEL -  Abnormal; Notable for the following components:      Result Value   Glucose, Bld 192 (*)    GFR, Estimated 56 (*)    All other components within normal limits  CBC - Abnormal; Notable for the following components:   Hemoglobin 11.7 (*)    All other components within normal limits  BETA-HYDROXYBUTYRIC ACID - Abnormal; Notable for the following components:   Beta-Hydroxybutyric Acid 1.62 (*)    All other components within normal limits  BLOOD GAS, VENOUS - Abnormal; Notable for the following components:   Bicarbonate 29.4 (*)    Acid-Base Excess 2.8 (*)    All other components within normal limits  URINALYSIS, ROUTINE W REFLEX MICROSCOPIC - Abnormal; Notable for the following components:   Color, Urine YELLOW (*)    APPearance CLEAR (*)    Specific Gravity, Urine 1.031 (*)    Glucose, UA 50 (*)    Ketones, ur 20 (*)    Protein, ur 30 (*)  All other components within normal limits  TROPONIN I (HIGH SENSITIVITY) - Abnormal; Notable for the following components:   Troponin I (High Sensitivity) 35 (*)    All other components within normal limits  GASTROINTESTINAL PANEL BY PCR, STOOL (REPLACES STOOL CULTURE)  C DIFFICILE QUICK SCREEN W PCR REFLEX    LIPASE, BLOOD  TROPONIN I (HIGH SENSITIVITY)     EKG  EKG read interpreted by me shows normal sinus rhythm 85 left axis nonspecific ST-T wave changes   RADIOLOGY CT read by radiology reviewed by me and interpreted by me shows colitis   PROCEDURES:  Critical Care performed: Critical care time 20 minutes.  This includes discussing the patient with her family members and with her herself.  Additionally I reviewed the labs and other findings like the CT and EKG actually looks similar to 1 from 2018.  Procedures   MEDICATIONS ORDERED IN ED: Medications  ondansetron (ZOFRAN-ODT) disintegrating tablet 4 mg (4 mg Oral Given 01/04/22 1204)  sodium chloride 0.9 % bolus 1,000 mL (0 mLs Intravenous Stopped 01/04/22 1715)  ondansetron  (ZOFRAN) injection 4 mg (4 mg Intravenous Given 01/04/22 1443)  iohexol (OMNIPAQUE) 300 MG/ML solution 100 mL (100 mLs Intravenous Contrast Given 01/04/22 1520)  labetalol (NORMODYNE) injection 5 mg (5 mg Intravenous Given 01/04/22 1716)     IMPRESSION / MDM / ASSESSMENT AND PLAN / ED COURSE  I reviewed the triage vital signs and the nursing notes. Patient elevated beta hydroxybutyrate on her insulin pump she was able to keep down 1 cup of fluid but then got nauseated again and was wanting to vomit.  This is in spite of 2 doses of Zofran.  Patient has been a diabetic for 40 years she is 88 years old I think the safest thing to do would be to keep her in the hospital until she is able to take well p.o.  Blood pressures trending upwards because she has not been able to take any of her blood pressure medications.  I give her some labetalol to cover this.  I spoken to the hospitalist hospitalist agrees.  Differential diagnosis includes, but is not limited to, differential diagnosis includes cardiac disease diabetic gastroparesis infectious diarrhea inflammatory diarrhea intestinal obstruction etc.  Patient's presentation is most consistent with acute presentation with potential threat to life or bodily function.  {The patient is on the cardiac monitor to evaluate for evidence of arrhythmia and/or significant heart rate changes none have been seen   FINAL CLINICAL IMPRESSION(S) / ED DIAGNOSES   Final diagnoses:  Nausea vomiting and diarrhea  Dehydration  Elevated beta-hydroxybutyric acid level     Rx / DC Orders   ED Discharge Orders     None        Note:  This document was prepared using Dragon voice recognition software and may include unintentional dictation errors.   Arnaldo Natal, MD 01/04/22 [phone]

## 2022-01-04 NOTE — Assessment & Plan Note (Signed)
Continue sinemet as at home.

## 2022-01-04 NOTE — Assessment & Plan Note (Addendum)
Mild inflammation of the colonic mucosa in descending colon and sigmoid colon. With no elevation of WBC, and no fever, findings are concerning for ischemic colitis. The patient will be given IV fluids and IV Levaquin and flagyl. No further diarrhea.  The patient continuees to have pain in the left lower quadrant. Continue IV antibiotics. Advance diet. May need GI consult if not improved by tomorrow.

## 2022-01-04 NOTE — ED Triage Notes (Signed)
Pt here with vomiting. Pt also having diarrhea since Tuesday. Pt denies fever. Pt has not been able to eat or take her medication due to the vomiting.

## 2022-01-04 NOTE — Assessment & Plan Note (Signed)
Resolved. Mild, likely due to colitis. The patient will be placed on Endotool with D5 1/2 NS IV fluid to correct DKA which is primarily due to insulin deficiency with minor hyperglycemia. Insulin pump was turned off. The patient has closed her gap and beta hydroxy butyric acid has declined to the normal range. She has been transitioned to subcutaneous insulin, and was transferred to the floor.  Glucoses have been 40-244 in the last 24 hours. Lantus has been decreased from 12 units to 6 units. Continue to monitor.

## 2022-01-05 ENCOUNTER — Encounter: Payer: Self-pay | Admitting: Internal Medicine

## 2022-01-05 DIAGNOSIS — K523 Indeterminate colitis: Secondary | ICD-10-CM | POA: Diagnosis not present

## 2022-01-05 DIAGNOSIS — I2510 Atherosclerotic heart disease of native coronary artery without angina pectoris: Secondary | ICD-10-CM | POA: Diagnosis not present

## 2022-01-05 DIAGNOSIS — G20 Parkinson's disease: Secondary | ICD-10-CM | POA: Diagnosis not present

## 2022-01-05 DIAGNOSIS — E1010 Type 1 diabetes mellitus with ketoacidosis without coma: Secondary | ICD-10-CM | POA: Diagnosis not present

## 2022-01-05 LAB — CBC WITH DIFFERENTIAL/PLATELET
Abs Immature Granulocytes: 0.03 10*3/uL (ref 0.00–0.07)
Basophils Absolute: 0 10*3/uL (ref 0.0–0.1)
Basophils Relative: 0 %
Eosinophils Absolute: 0.1 10*3/uL (ref 0.0–0.5)
Eosinophils Relative: 2 %
HCT: 29.9 % — ABNORMAL LOW (ref 36.0–46.0)
Hemoglobin: 9.7 g/dL — ABNORMAL LOW (ref 12.0–15.0)
Immature Granulocytes: 0 %
Lymphocytes Relative: 22 %
Lymphs Abs: 1.9 10*3/uL (ref 0.7–4.0)
MCH: 31 pg (ref 26.0–34.0)
MCHC: 32.4 g/dL (ref 30.0–36.0)
MCV: 95.5 fL (ref 80.0–100.0)
Monocytes Absolute: 1 10*3/uL (ref 0.1–1.0)
Monocytes Relative: 12 %
Neutro Abs: 5.5 10*3/uL (ref 1.7–7.7)
Neutrophils Relative %: 64 %
Platelets: 150 10*3/uL (ref 150–400)
RBC: 3.13 MIL/uL — ABNORMAL LOW (ref 3.87–5.11)
RDW: 12.8 % (ref 11.5–15.5)
WBC: 8.6 10*3/uL (ref 4.0–10.5)
nRBC: 0 % (ref 0.0–0.2)

## 2022-01-05 LAB — GLUCOSE, CAPILLARY
Glucose-Capillary: 115 mg/dL — ABNORMAL HIGH (ref 70–99)
Glucose-Capillary: 121 mg/dL — ABNORMAL HIGH (ref 70–99)
Glucose-Capillary: 128 mg/dL — ABNORMAL HIGH (ref 70–99)
Glucose-Capillary: 128 mg/dL — ABNORMAL HIGH (ref 70–99)
Glucose-Capillary: 136 mg/dL — ABNORMAL HIGH (ref 70–99)
Glucose-Capillary: 136 mg/dL — ABNORMAL HIGH (ref 70–99)
Glucose-Capillary: 143 mg/dL — ABNORMAL HIGH (ref 70–99)
Glucose-Capillary: 145 mg/dL — ABNORMAL HIGH (ref 70–99)
Glucose-Capillary: 145 mg/dL — ABNORMAL HIGH (ref 70–99)
Glucose-Capillary: 150 mg/dL — ABNORMAL HIGH (ref 70–99)
Glucose-Capillary: 161 mg/dL — ABNORMAL HIGH (ref 70–99)

## 2022-01-05 LAB — MRSA NEXT GEN BY PCR, NASAL: MRSA by PCR Next Gen: NOT DETECTED

## 2022-01-05 LAB — BASIC METABOLIC PANEL
Anion gap: 5 (ref 5–15)
Anion gap: 6 (ref 5–15)
Anion gap: 6 (ref 5–15)
Anion gap: 6 (ref 5–15)
BUN: 10 mg/dL (ref 8–23)
BUN: 13 mg/dL (ref 8–23)
BUN: 14 mg/dL (ref 8–23)
BUN: 15 mg/dL (ref 8–23)
CO2: 23 mmol/L (ref 22–32)
CO2: 23 mmol/L (ref 22–32)
CO2: 24 mmol/L (ref 22–32)
CO2: 24 mmol/L (ref 22–32)
Calcium: 8.3 mg/dL — ABNORMAL LOW (ref 8.9–10.3)
Calcium: 8.4 mg/dL — ABNORMAL LOW (ref 8.9–10.3)
Calcium: 8.4 mg/dL — ABNORMAL LOW (ref 8.9–10.3)
Calcium: 8.4 mg/dL — ABNORMAL LOW (ref 8.9–10.3)
Chloride: 104 mmol/L (ref 98–111)
Chloride: 105 mmol/L (ref 98–111)
Chloride: 105 mmol/L (ref 98–111)
Chloride: 106 mmol/L (ref 98–111)
Creatinine, Ser: 0.69 mg/dL (ref 0.44–1.00)
Creatinine, Ser: 0.74 mg/dL (ref 0.44–1.00)
Creatinine, Ser: 0.74 mg/dL (ref 0.44–1.00)
Creatinine, Ser: 0.74 mg/dL (ref 0.44–1.00)
GFR, Estimated: >60 mL/min (ref >60)
GFR, Estimated: >60 mL/min (ref >60)
GFR, Estimated: >60 mL/min (ref >60)
GFR, Estimated: >60 mL/min (ref >60)
Glucose, Bld: 136 mg/dL — ABNORMAL HIGH (ref 70–99)
Glucose, Bld: 140 mg/dL — ABNORMAL HIGH (ref 70–99)
Glucose, Bld: 149 mg/dL — ABNORMAL HIGH (ref 70–99)
Glucose, Bld: 204 mg/dL — ABNORMAL HIGH (ref 70–99)
Potassium: 3.3 mmol/L — ABNORMAL LOW (ref 3.5–5.1)
Potassium: 3.4 mmol/L — ABNORMAL LOW (ref 3.5–5.1)
Potassium: 3.5 mmol/L (ref 3.5–5.1)
Potassium: 3.5 mmol/L (ref 3.5–5.1)
Sodium: 134 mmol/L — ABNORMAL LOW (ref 135–145)
Sodium: 134 mmol/L — ABNORMAL LOW (ref 135–145)
Sodium: 134 mmol/L — ABNORMAL LOW (ref 135–145)
Sodium: 135 mmol/L (ref 135–145)

## 2022-01-05 LAB — BETA-HYDROXYBUTYRIC ACID
Beta-Hydroxybutyric Acid: 0.56 mmol/L — ABNORMAL HIGH (ref 0.05–0.27)
Beta-Hydroxybutyric Acid: 0.43 mmol/L — ABNORMAL HIGH (ref 0.05–0.27)

## 2022-01-05 LAB — CBG MONITORING, ED
Glucose-Capillary: 167 mg/dL — ABNORMAL HIGH (ref 70–99)
Glucose-Capillary: 191 mg/dL — ABNORMAL HIGH (ref 70–99)

## 2022-01-05 LAB — CREATININE, SERUM
Creatinine, Ser: 0.75 mg/dL (ref 0.44–1.00)
GFR, Estimated: >60 mL/min (ref >60)

## 2022-01-05 LAB — HEMOGLOBIN A1C
Hgb A1c MFr Bld: 7.1 % — ABNORMAL HIGH (ref 4.8–5.6)
Mean Plasma Glucose: 157.07 mg/dL

## 2022-01-05 MED ORDER — ALUM & MAG HYDROXIDE-SIMETH 200-200-20 MG/5ML PO SUSP
15.0000 mL | ORAL | Status: DC | PRN
  Administered 2022-01-05 – 2022-01-09 (×8): 15 mL via ORAL
  Filled 2022-01-05 (×8): qty 30

## 2022-01-05 MED ORDER — INSULIN ASPART 100 UNIT/ML IJ SOLN
0.0000 [IU] | INTRAMUSCULAR | Status: DC
  Administered 2022-01-05 (×2): 2 [IU] via SUBCUTANEOUS
  Administered 2022-01-06 (×2): 3 [IU] via SUBCUTANEOUS
  Administered 2022-01-06: 5 [IU] via SUBCUTANEOUS
  Administered 2022-01-06: 3 [IU] via SUBCUTANEOUS
  Administered 2022-01-07: 5 [IU] via SUBCUTANEOUS
  Administered 2022-01-07: 8 [IU] via SUBCUTANEOUS
  Administered 2022-01-07: 3 [IU] via SUBCUTANEOUS
  Filled 2022-01-05 (×9): qty 1

## 2022-01-05 MED ORDER — POTASSIUM CHLORIDE CRYS ER 20 MEQ PO TBCR
40.0000 meq | EXTENDED_RELEASE_TABLET | Freq: Once | ORAL | Status: AC
  Administered 2022-01-05: 40 meq via ORAL
  Filled 2022-01-05: qty 2

## 2022-01-05 MED ORDER — INSULIN GLARGINE-YFGN 100 UNIT/ML ~~LOC~~ SOLN
12.0000 [IU] | Freq: Every day | SUBCUTANEOUS | Status: DC
  Administered 2022-01-06: 12 [IU] via SUBCUTANEOUS
  Filled 2022-01-05: qty 0.12

## 2022-01-05 MED ORDER — INSULIN GLARGINE-YFGN 100 UNIT/ML ~~LOC~~ SOLN
12.0000 [IU] | Freq: Every day | SUBCUTANEOUS | Status: DC
Start: 2022-01-05 — End: 2022-01-05
  Administered 2022-01-05: 12 [IU] via SUBCUTANEOUS
  Filled 2022-01-05: qty 0.12

## 2022-01-05 MED ORDER — ENOXAPARIN SODIUM 40 MG/0.4ML IJ SOSY
40.0000 mg | PREFILLED_SYRINGE | INTRAMUSCULAR | Status: DC
  Administered 2022-01-05 – 2022-01-08 (×4): 40 mg via SUBCUTANEOUS
  Filled 2022-01-05 (×4): qty 0.4

## 2022-01-05 MED ORDER — ONDANSETRON HCL 4 MG/2ML IJ SOLN
4.0000 mg | Freq: Four times a day (QID) | INTRAMUSCULAR | Status: DC | PRN
  Administered 2022-01-05: 4 mg via INTRAVENOUS
  Filled 2022-01-05: qty 2

## 2022-01-05 MED ORDER — CHLORHEXIDINE GLUCONATE CLOTH 2 % EX PADS
6.0000 | MEDICATED_PAD | Freq: Every day | CUTANEOUS | Status: DC
  Administered 2022-01-06: 6 via TOPICAL

## 2022-01-05 NOTE — ED Notes (Signed)
Report given to [facility], CCU RN.

## 2022-01-05 NOTE — Progress Notes (Signed)
PROGRESS NOTE  Lindsey Padilla KGM:[id number] DOB: [birth year] DOA: 01/04/2022 PCP: Mick Sell, MD  Brief History   Lindsey Padilla is a 88 y.o. female with medical history significant of DMI, Parkinsons, CAD, S/P PCI, Hyperlipidemia, Hypertension, chronic constipation.   The patient presented with complaints of Diarrhea since Tuesday followed by nausea and vomiting on Wenesday. She states that she hasn't been able to keep anything down since Tuesday including her usual medications other than insulin in pump. The diarrhea has not been bloody.    She denies fevers or chills. No hematemsis, coffee ground emesis, hematochezia, or melena.    In the ED the patient was found to be in mild DKA with a beta hydroxybutyric acid of 1.65, and ketones in urine. PH is normal. Glucose was 190.  CT of the abdomen and pelvis demonstrated mild thickening of the descending and sigmoid colon wall suspicious for infectious of inflammatory colitis. Lab work demonstrated a troponin of 35, Creatinine of 0.98. and hemoglobin of 11.7. There is no increased WBC.   Overnight the patient closed her gap on the endotool. She states that she is feeling better. She has been transitioned to subcutaneous insulin and will be transferred to the floor.  She is receiving IV Levaquin and flagyl. No further diarrhea or vomiting.  Consultants  None  Procedures  None  Antibiotics   Anti-infectives (From admission, onward)    Start     Dose/Rate Route Frequency Ordered Stop   01/04/22 1830  levofloxacin (LEVAQUIN) IVPB 750 mg        750 mg 100 mL/hr over 90 Minutes Intravenous Every 48 hours 01/04/22 1809     01/04/22 1815  metroNIDAZOLE (FLAGYL) IVPB 500 mg        500 mg 100 mL/hr over 60 Minutes Intravenous Every 8 hours 01/04/22 1809        Subjective  The patient is resting comfortably. She states that she is feeling better. Daughter is at bedside.  Objective   Vitals:  Vitals:   01/05/22 0900  01/05/22 1000  BP: (!) 149/60   Pulse: 69 70  Resp: (!) 21 15  Temp:    SpO2: 98% 99%    Exam:  Constitutional:  The patient is awake, alert, and oriented x 3. No acute distress. Respiratory:  No increased work of breathing. No wheezes, rales, or rhonchi No tactile fremitus Cardiovascular:  Regular rate and rhythm No murmurs, ectopy, or gallups. No lateral PMI. No thrills. Abdomen:  Abdomen is soft, non-tender, non-distended No hernias, masses, or organomegaly Normoactive bowel sounds.  Musculoskeletal:  No cyanosis, clubbing, or edema Skin:  No rashes, lesions, ulcers palpation of skin: no induration or nodules Neurologic:  CN 2-12 intact Sensation all 4 extremities intact Psychiatric:  Mental status Mood, affect appropriate Orientation to person, place, time  judgment and insight appear intact   I have personally reviewed the following:   Today's Data  Vitals  Lab Data  CBC BMP Glucoses  Micro Data  MRSA screen negative  Imaging  CT abdomen and pelvis  Cardiology Data  EKG  Other Data    Scheduled Meds:  aspirin EC  81 mg Oral Daily   Chlorhexidine Gluconate Cloth  6 each Topical Daily   citalopram  10 mg Oral Daily   gabapentin  100 mg Oral QPC breakfast   gabapentin  200 mg Oral QHS   insulin aspart  0-15 Units Subcutaneous Q4H   [START ON 01/06/2022] insulin glargine-yfgn  12 Units  Subcutaneous Daily   levothyroxine  100 mcg Oral Daily   metoprolol succinate  25 mg Oral Daily   Continuous Infusions:  levofloxacin (LEVAQUIN) IV Stopped (01/05/22 0011)   metronidazole 500 mg (01/05/22 1037)    Principal Problem:   DKA, type 1 (HCC) Active Problems:   Colitis, indeterminate   Parkinson disease (HCC)   CAD S/P percutaneous coronary angioplasty   LOS: 1 day    A & P  Assessment and Plan: * DKA, type 1 (HCC) Mild, likely due to colitis. The patient will be placed on Endotool with D5 1/2 NS IV fluid to correct DKA which is  primarily due to insulin deficiency with minor hyperglycemia. Insulin pump was turned off. The patient has closed her gap and beta hydroxy butyric acid has declined to the normal range. She has been transitioned to subcutaneous insulin, and will be transferred to the floor.  CAD S/P percutaneous coronary angioplasty Continue home doses of Plavix, ASA, and metoprolol. Monitor on telemetry. Troponin 35 in ED. No complaints of chest pain.  Parkinson disease (HCC) Continue sinemet as at home.  Colitis, indeterminate Mild inflammation of the colonic mucosa in descending colon and sigmoid colon. With no elevation of WBC, and no fever, findings are concerning for ischemic colitis. The patient will be given IV fluids and IV Levaquin and flagyl. No further diarrhea.   I have seen and examined this patient myself. I have spent 34 minute in her evaluation and care.  DVT prophylaxis: Lovenox Code Status: Full Code Family Communication: Daughter is at bedside Disposition Plan: Home    Mattye Verdone, DO [facility] Direct contact: see [email or web address]  7PM-7AM contact night coverage as above 01/05/2022, 2:51 PM  LOS: 1 day

## 2022-01-05 NOTE — Progress Notes (Signed)
Report received from ICU nurse.

## 2022-01-05 NOTE — Progress Notes (Signed)
Endotool recommending transition of pt. to sliding scale/long acting insulin. Dr. Gerri Lins notified and confirmed recommended parameters.

## 2022-01-05 NOTE — Progress Notes (Signed)
Skin assessment done. IV antibiotics started. PO K replacement given and prn Mylanta for indigestion.  VSS

## 2022-01-06 DIAGNOSIS — I2510 Atherosclerotic heart disease of native coronary artery without angina pectoris: Secondary | ICD-10-CM | POA: Diagnosis not present

## 2022-01-06 DIAGNOSIS — K523 Indeterminate colitis: Secondary | ICD-10-CM | POA: Diagnosis not present

## 2022-01-06 DIAGNOSIS — G20 Parkinson's disease: Secondary | ICD-10-CM | POA: Diagnosis not present

## 2022-01-06 DIAGNOSIS — E1010 Type 1 diabetes mellitus with ketoacidosis without coma: Secondary | ICD-10-CM | POA: Diagnosis not present

## 2022-01-06 LAB — BASIC METABOLIC PANEL
Anion gap: 4 — ABNORMAL LOW (ref 5–15)
BUN: 8 mg/dL (ref 8–23)
CO2: 26 mmol/L (ref 22–32)
Calcium: 8.4 mg/dL — ABNORMAL LOW (ref 8.9–10.3)
Chloride: 110 mmol/L (ref 98–111)
Creatinine, Ser: 0.75 mg/dL (ref 0.44–1.00)
GFR, Estimated: >60 mL/min (ref >60)
Glucose, Bld: 45 mg/dL — ABNORMAL LOW (ref 70–99)
Potassium: 3.7 mmol/L (ref 3.5–5.1)
Sodium: 140 mmol/L (ref 135–145)

## 2022-01-06 LAB — GLUCOSE, CAPILLARY
Glucose-Capillary: 151 mg/dL — ABNORMAL HIGH (ref 70–99)
Glucose-Capillary: 163 mg/dL — ABNORMAL HIGH (ref 70–99)
Glucose-Capillary: 172 mg/dL — ABNORMAL HIGH (ref 70–99)
Glucose-Capillary: 197 mg/dL — ABNORMAL HIGH (ref 70–99)
Glucose-Capillary: 244 mg/dL — ABNORMAL HIGH (ref 70–99)
Glucose-Capillary: 37 mg/dL — CL (ref 70–99)
Glucose-Capillary: 40 mg/dL — CL (ref 70–99)
Glucose-Capillary: 72 mg/dL (ref 70–99)
Glucose-Capillary: 91 mg/dL (ref 70–99)

## 2022-01-06 MED ORDER — INSULIN GLARGINE-YFGN 100 UNIT/ML ~~LOC~~ SOLN
6.0000 [IU] | Freq: Every day | SUBCUTANEOUS | Status: DC
  Administered 2022-01-07: 6 [IU] via SUBCUTANEOUS
  Filled 2022-01-06: qty 0.06

## 2022-01-06 NOTE — TOC Initial Note (Signed)
Transition of Care [facility]) - Initial/Assessment Note    Patient Details  Name: Lindsey Padilla MRN: [id number] Date of Birth: 1932-10-12  Transition of Care [facility]) CM/SW Contact:    Chapman Fitch, RN Phone Number: 01/06/2022, 11:12 AM  Clinical Narrative:                     Transition of Care [facility]) Screening Note   Patient Details  Name: Lindsey Padilla Date of Birth: 11/05/1932   Transition of Care [facility]) CM/SW Contact:    Chapman Fitch, RN Phone Number: 01/06/2022, 11:12 AM    Transition of Care Department [facility]) has reviewed patient and no TOC needs have been identified at this time. We will continue to monitor patient advancement through interdisciplinary progression rounds. If new patient transition needs arise, please place a TOC consult.       Patient Goals and CMS Choice        Expected Discharge Plan and Services                                                Prior Living Arrangements/Services                       Activities of Daily Living Home Assistive Devices/Equipment: Cane (specify quad or straight), Walker (specify type) ADL Screening (condition at time of admission) Patient's cognitive ability adequate to safely complete daily activities?: No Is the patient deaf or have difficulty hearing?: No Does the patient have difficulty seeing, even when wearing glasses/contacts?: No Does the patient have difficulty concentrating, remembering, or making decisions?: No Patient able to express need for assistance with ADLs?: Yes Does the patient have difficulty dressing or bathing?: No Independently performs ADLs?: Yes (appropriate for developmental age) Does the patient have difficulty walking or climbing stairs?: No Weakness of Legs: None Weakness of Arms/Hands: None  Permission Sought/Granted                  Emotional Assessment              Admission diagnosis:  Dehydration [E86.0] DKA, type 1 (HCC)  [E10.10] Nausea vomiting and diarrhea [R11.2, R19.7] Elevated beta-hydroxybutyric acid level [R78.89] Patient Active Problem List   Diagnosis Date Noted   DKA, type 1 (HCC) 01/04/2022   Colitis, indeterminate 01/04/2022   Parkinson disease (HCC) 01/04/2022   CAD S/P percutaneous coronary angioplasty 01/04/2022   Urge incontinence 05/26/2018   PCP:  Mick Sell, MD Pharmacy:   [facility] [address] [place] - [address] [place] [address] Phone: [phone] Fax: [phone]     Social Determinants of Health (SDOH) Interventions    Readmission Risk Interventions     No data to display

## 2022-01-06 NOTE — Progress Notes (Signed)
PROGRESS NOTE  Lindsey Padilla DZH:[id number] DOB: Dec 27, 1932 DOA: 01/04/2022 PCP: Mick Sell, MD  Brief History   Lindsey Padilla is a 88 y.o. female with medical history significant of DMI, Parkinsons, CAD, S/P PCI, Hyperlipidemia, Hypertension, chronic constipation.   The patient presented with complaints of Diarrhea since Tuesday followed by nausea and vomiting on Wenesday. She states that she hasn't been able to keep anything down since Tuesday including her usual medications other than insulin in pump. The diarrhea has not been bloody.    She denies fevers or chills. No hematemsis, coffee ground emesis, hematochezia, or melena.    In the ED the patient was found to be in mild DKA with a beta hydroxybutyric acid of 1.65, and ketones in urine. PH is normal. Glucose was 190.  CT of the abdomen and pelvis demonstrated mild thickening of the descending and sigmoid colon wall suspicious for infectious of inflammatory colitis. Lab work demonstrated a troponin of 35, Creatinine of 0.98. and hemoglobin of 11.7. There is no increased WBC.   Overnight the patient closed her gap on the endotool. She states that she is feeling better. She has been transitioned to subcutaneous insulin and has been transferred to the floor.  She is receiving IV Levaquin and flagyl. No further diarrhea or vomiting.  The patient was hypoglycemic to the 40's this morning. Lantus was decreased from 12 units daily to 6 units.   Consultants  None  Procedures  None  Antibiotics   Anti-infectives (From admission, onward)    Start     Dose/Rate Route Frequency Ordered Stop   01/04/22 1830  levofloxacin (LEVAQUIN) IVPB 750 mg        750 mg 100 mL/hr over 90 Minutes Intravenous Every 48 hours 01/04/22 1809     01/04/22 1815  metroNIDAZOLE (FLAGYL) IVPB 500 mg        500 mg 100 mL/hr over 60 Minutes Intravenous Every 8 hours 01/04/22 1809        Subjective  The patient is resting comfortably. She  states that she is feeling better. Daughter is at bedside.  Objective   Vitals:  Vitals:   01/06/22 0417 01/06/22 0758  BP: (!) 159/78 (!) 143/62  Pulse: 76 64  Resp: 16 18  Temp: 97.9 F (36.6 C) 97.6 F (36.4 C)  SpO2: 94% 100%    Exam:  Constitutional:  The patient is awake, alert, and oriented x 3. No acute distress. Respiratory:  No increased work of breathing. No wheezes, rales, or rhonchi No tactile fremitus Cardiovascular:  Regular rate and rhythm No murmurs, ectopy, or gallups. No lateral PMI. No thrills. Abdomen:  Abdomen is soft, non-tender, non-distended No hernias, masses, or organomegaly Normoactive bowel sounds.  Musculoskeletal:  No cyanosis, clubbing, or edema Skin:  No rashes, lesions, ulcers palpation of skin: no induration or nodules Neurologic:  CN 2-12 intact Sensation all 4 extremities intact Psychiatric:  Mental status Mood, affect appropriate Orientation to person, place, time  judgment and insight appear intact   I have personally reviewed the following:   Today's Data  Vitals  Lab Data  CBC BMP Glucoses  Micro Data  MRSA screen negative  Imaging  CT abdomen and pelvis  Cardiology Data  EKG  Other Data    Scheduled Meds:  aspirin EC  81 mg Oral Daily   Chlorhexidine Gluconate Cloth  6 each Topical Daily   citalopram  10 mg Oral Daily   enoxaparin (LOVENOX) injection  40 mg Subcutaneous  Q24H   gabapentin  100 mg Oral QPC breakfast   gabapentin  200 mg Oral QHS   insulin aspart  0-15 Units Subcutaneous Q4H   [START ON 01/07/2022] insulin glargine-yfgn  6 Units Subcutaneous Daily   levothyroxine  100 mcg Oral Daily   metoprolol succinate  25 mg Oral Daily   Continuous Infusions:  levofloxacin (LEVAQUIN) IV Stopped (01/05/22 0011)   metronidazole 500 mg (01/06/22 7793)    Principal Problem:   DKA, type 1 (HCC) Active Problems:   Colitis, indeterminate   Parkinson disease (HCC)   CAD S/P percutaneous  coronary angioplasty   LOS: 2 days    A & P  Assessment and Plan: * DKA, type 1 (HCC) Mild, likely due to colitis. The patient will be placed on Endotool with D5 1/2 NS IV fluid to correct DKA which is primarily due to insulin deficiency with minor hyperglycemia. Insulin pump was turned off. The patient has closed her gap and beta hydroxy butyric acid has declined to the normal range. She has been transitioned to subcutaneous insulin, and was transferred to the floor.  Glucoses have been 40-244 in the last 24 hours. Lantus has been decreased from 12 units to 6 units. Continue to monitor.  CAD S/P percutaneous coronary angioplasty Continue home doses of Plavix, ASA, and metoprolol. Monitor on telemetry. Troponin 35 in ED. No complaints of chest pain.  Parkinson disease (HCC) Continue sinemet as at home.  Colitis, indeterminate Mild inflammation of the colonic mucosa in descending colon and sigmoid colon. With no elevation of WBC, and no fever, findings are concerning for ischemic colitis. The patient will be given IV fluids and IV Levaquin and flagyl. No further diarrhea.  The patient continuees to have pain in the left lower quadrant. Continue IV antibiotics.   I have seen and examined this patient myself. I have spent 34 minute in her evaluation and care.  DVT prophylaxis: Lovenox Code Status: Full Code Family Communication: Daughter is at bedside Disposition Plan: Home    Yuleni Burich, DO [facility] Direct contact: see [email or web address]  7PM-7AM contact night coverage as above 01/06/2022, 4:36 PM  LOS: 1 day

## 2022-01-07 DIAGNOSIS — G20 Parkinson's disease: Secondary | ICD-10-CM | POA: Diagnosis not present

## 2022-01-07 DIAGNOSIS — E1010 Type 1 diabetes mellitus with ketoacidosis without coma: Secondary | ICD-10-CM | POA: Diagnosis not present

## 2022-01-07 DIAGNOSIS — I2510 Atherosclerotic heart disease of native coronary artery without angina pectoris: Secondary | ICD-10-CM | POA: Diagnosis not present

## 2022-01-07 DIAGNOSIS — K523 Indeterminate colitis: Secondary | ICD-10-CM | POA: Diagnosis not present

## 2022-01-07 LAB — BASIC METABOLIC PANEL
Anion gap: 5 (ref 5–15)
BUN: 8 mg/dL (ref 8–23)
CO2: 27 mmol/L (ref 22–32)
Calcium: 8.6 mg/dL — ABNORMAL LOW (ref 8.9–10.3)
Chloride: 105 mmol/L (ref 98–111)
Creatinine, Ser: 0.73 mg/dL (ref 0.44–1.00)
GFR, Estimated: >60 mL/min (ref >60)
Glucose, Bld: 156 mg/dL — ABNORMAL HIGH (ref 70–99)
Potassium: 3.5 mmol/L (ref 3.5–5.1)
Sodium: 137 mmol/L (ref 135–145)

## 2022-01-07 LAB — GLUCOSE, CAPILLARY
Glucose-Capillary: 175 mg/dL — ABNORMAL HIGH (ref 70–99)
Glucose-Capillary: 88 mg/dL (ref 70–99)
Glucose-Capillary: 149 mg/dL — ABNORMAL HIGH (ref 70–99)
Glucose-Capillary: 231 mg/dL — ABNORMAL HIGH (ref 70–99)
Glucose-Capillary: 287 mg/dL — ABNORMAL HIGH (ref 70–99)
Glucose-Capillary: 68 mg/dL — ABNORMAL LOW (ref 70–99)
Glucose-Capillary: 74 mg/dL (ref 70–99)
Glucose-Capillary: 87 mg/dL (ref 70–99)
Glucose-Capillary: 129 mg/dL — ABNORMAL HIGH (ref 70–99)
Glucose-Capillary: 179 mg/dL — ABNORMAL HIGH (ref 70–99)

## 2022-01-07 LAB — CBC WITH DIFFERENTIAL/PLATELET
Abs Immature Granulocytes: 0.02 10*3/uL (ref 0.00–0.07)
Basophils Absolute: 0 10*3/uL (ref 0.0–0.1)
Basophils Relative: 0 %
Eosinophils Absolute: 0.5 10*3/uL (ref 0.0–0.5)
Eosinophils Relative: 7 %
HCT: 30.7 % — ABNORMAL LOW (ref 36.0–46.0)
Hemoglobin: 10.1 g/dL — ABNORMAL LOW (ref 12.0–15.0)
Immature Granulocytes: 0 %
Lymphocytes Relative: 39 %
Lymphs Abs: 2.7 10*3/uL (ref 0.7–4.0)
MCH: 30.9 pg (ref 26.0–34.0)
MCHC: 32.9 g/dL (ref 30.0–36.0)
MCV: 93.9 fL (ref 80.0–100.0)
Monocytes Absolute: 0.8 10*3/uL (ref 0.1–1.0)
Monocytes Relative: 12 %
Neutro Abs: 2.9 10*3/uL (ref 1.7–7.7)
Neutrophils Relative %: 42 %
Platelets: 167 10*3/uL (ref 150–400)
RBC: 3.27 MIL/uL — ABNORMAL LOW (ref 3.87–5.11)
RDW: 12.3 % (ref 11.5–15.5)
WBC: 6.8 10*3/uL (ref 4.0–10.5)
nRBC: 0 % (ref 0.0–0.2)

## 2022-01-07 MED ORDER — INSULIN ASPART 100 UNIT/ML IJ SOLN
0.0000 [IU] | Freq: Three times a day (TID) | INTRAMUSCULAR | Status: DC
  Administered 2022-01-08: 3 [IU] via SUBCUTANEOUS
  Administered 2022-01-08: 1 [IU] via SUBCUTANEOUS
  Administered 2022-01-08: 2 [IU] via SUBCUTANEOUS
  Administered 2022-01-09: 3 [IU] via SUBCUTANEOUS
  Administered 2022-01-09: 2 [IU] via SUBCUTANEOUS
  Filled 2022-01-07 (×5): qty 1

## 2022-01-07 MED ORDER — INSULIN ASPART 100 UNIT/ML IJ SOLN
3.0000 [IU] | Freq: Three times a day (TID) | INTRAMUSCULAR | Status: DC
  Administered 2022-01-08 – 2022-01-09 (×5): 3 [IU] via SUBCUTANEOUS
  Filled 2022-01-07 (×5): qty 1

## 2022-01-07 MED ORDER — INSULIN GLARGINE-YFGN 100 UNIT/ML ~~LOC~~ SOLN
3.0000 [IU] | Freq: Every day | SUBCUTANEOUS | Status: DC
  Administered 2022-01-08 – 2022-01-09 (×2): 3 [IU] via SUBCUTANEOUS
  Filled 2022-01-07 (×2): qty 0.03

## 2022-01-07 NOTE — Care Management Important Message (Signed)
Important Message  Patient Details  Name: Lindsey Padilla MRN: [id number] Date of Birth: 07/21/1932   Medicare Important Message Given:  N/A - LOS <3 / Initial given by admissions     Johnell Comings 01/07/2022, 8:02 AM

## 2022-01-07 NOTE — Inpatient Diabetes Management (Signed)
Inpatient Diabetes Program Recommendations  AACE/ADA: New Consensus Statement on Inpatient Glycemic Control (2015)  Target Ranges:  Prepandial:   less than 140 mg/dL      Peak postprandial:   less than 180 mg/dL (1-2 hours)      Critically ill patients:  140 - 180 mg/dL   Lab Results  Component Value Date   GLUCAP 287 (H) 01/07/2022   HGBA1C 7.1 (H) 01/05/2022    Review of Glycemic Control  Latest Reference Range & Units 01/07/22 04:17 01/07/22 07:57 01/07/22 11:19  Glucose-Capillary 70 - 99 mg/dL 629 (H) 476 (H) 546 (H)  (H): Data is abnormally high Diabetes history: Type 1 DM (Requires basal & bolus) Outpatient Diabetes medications: Medtronic 630 G with Dexcom Current orders for Inpatient glycemic control: Semglee 6 units QD, Novolog 0-15 units Q4H  Inpatient Diabetes Program Recommendations:    Noted multiple episodes of hypoglycemia and subsequent reduction of basal insulin.   Now that patient has diet order, consider: -Slightly increasing Semglee to 8 units QD -changing correction to Novolog 0-6 units TID & HS -Adding Novolog 3 units TID (assuming patient is consuming >50% of meals)  Spoke with patient regarding outpatient diabetes management. Patient is followed by [facility] endocrinology with last appointment in feb 2023. At this visit insulin rates were reduced due to nocturnal hypoglycemia. Reviewed patient's current A1c of 7.1%. Explained what a A1c is and what it measures. Also reviewed goal A1c with patient, importance of good glucose control @ home, and blood sugar goals. Reviewed current glucose trends, hypoglycemia, goals, impact of nausea and vomiting with DKA.  Patient to follow up with Dr Gershon Crane in September. Has no questions at this time and has supplies at home.    Current insulin pump settings are as follows:  Basal insulin  0000-0700 0.175 units/hour 0701-1400 0.675 units/hour 1401-1700 0.475 units/hour 1701-1800 0.4 units/hour 1801-2100 0.425  units/hour 2100-2230 0.275 units/hour 2230-0000 0.150 units/hour  Total daily basal insulin: 9.7 units/24 hours  Carb Coverage 0000-1100 1 unit for every 5.8 grams of carbohydrates 1101-1700  1 unit for every 8.8 grams of carbohydrates 1701-0000 1 unit for every 9.7 grams of carbohydrates  Insulin Sensitivity 0000-1700 1:50 1 unit drops blood glucose 50 mg/dl 5035-4656 8:12 1 unit drops blood glucose 40 mg/dl 7517-0017 4:94 1 unit drops blood glucose 50 mg/dl     Thanks, Lujean Rave, MSN, RNC-OB Diabetes Coordinator [phone] (8a-5p)

## 2022-01-07 NOTE — Progress Notes (Signed)
PROGRESS NOTE  Lindsey Padilla KCL:[id number] DOB: 1932-05-31 DOA: 01/04/2022 PCP: Mick Sell, MD  Brief History   Lindsey Padilla is a 88 y.o. female with medical history significant of DMI, Parkinsons, CAD, S/P PCI, Hyperlipidemia, Hypertension, chronic constipation.   The patient presented with complaints of Diarrhea since Tuesday followed by nausea and vomiting on Wenesday. She states that she hasn't been able to keep anything down since Tuesday including her usual medications other than insulin in pump. The diarrhea has not been bloody.    She denies fevers or chills. No hematemsis, coffee ground emesis, hematochezia, or melena.    In the ED the patient was found to be in mild DKA with a beta hydroxybutyric acid of 1.65, and ketones in urine. PH is normal. Glucose was 190.  CT of the abdomen and pelvis demonstrated mild thickening of the descending and sigmoid colon wall suspicious for infectious of inflammatory colitis. Lab work demonstrated a troponin of 35, Creatinine of 0.98. and hemoglobin of 11.7. There is no increased WBC.   Overnight the patient closed her gap on the endotool. She states that she is feeling better. She has been transitioned to subcutaneous insulin and has been transferred to the floor.  She is receiving IV Levaquin and flagyl. No further diarrhea or vomiting.  The patient was hypoglycemic to the 40's on the morning of 01/06/2022. Lantus was decreased from 12 units daily to 6 units. Again the patient was low again this morning at 68. Lantus will be reduced to 3.  Consultants  None  Procedures  None  Antibiotics   Anti-infectives (From admission, onward)    Start     Dose/Rate Route Frequency Ordered Stop   01/04/22 1830  levofloxacin (LEVAQUIN) IVPB 750 mg        750 mg 100 mL/hr over 90 Minutes Intravenous Every 48 hours 01/04/22 1809     01/04/22 1815  metroNIDAZOLE (FLAGYL) IVPB 500 mg        500 mg 100 mL/hr over 60 Minutes  Intravenous Every 8 hours 01/04/22 1809        Subjective  The patient is resting comfortably.  Daughter is at bedside.  Objective   Vitals:  Vitals:   01/07/22 0759 01/07/22 1551  BP: (!) 159/61 (!) 130/55  Pulse: 65 63  Resp: 16 16  Temp: 97.6 F (36.4 C) 98.3 F (36.8 C)  SpO2: 97% 99%    Exam:  Constitutional:  The patient is awake, alert, and oriented x 3. No acute distress. Respiratory:  No increased work of breathing. No wheezes, rales, or rhonchi No tactile fremitus Cardiovascular:  Regular rate and rhythm No murmurs, ectopy, or gallups. No lateral PMI. No thrills. Abdomen:  Abdomen is soft, non-tender, non-distended No hernias, masses, or organomegaly Normoactive bowel sounds.  Musculoskeletal:  No cyanosis, clubbing, or edema Skin:  No rashes, lesions, ulcers palpation of skin: no induration or nodules Neurologic:  CN 2-12 intact Sensation all 4 extremities intact Psychiatric:  Mental status Mood, affect appropriate Orientation to person, place, time  judgment and insight appear intact   I have personally reviewed the following:   Today's Data  Vitals  Lab Data  CBC BMP Glucoses  Micro Data  MRSA screen negative  Imaging  CT abdomen and pelvis  Cardiology Data  EKG  Other Data    Scheduled Meds:  aspirin EC  81 mg Oral Daily   Chlorhexidine Gluconate Cloth  6 each Topical Daily   citalopram  10  mg Oral Daily   enoxaparin (LOVENOX) injection  40 mg Subcutaneous Q24H   gabapentin  100 mg Oral QPC breakfast   gabapentin  200 mg Oral QHS   insulin aspart  0-15 Units Subcutaneous Q4H   insulin aspart  0-6 Units Subcutaneous TID WC   insulin aspart  3 Units Subcutaneous TID WC   insulin glargine-yfgn  6 Units Subcutaneous Daily   levothyroxine  100 mcg Oral Daily   metoprolol succinate  25 mg Oral Daily   Continuous Infusions:  levofloxacin (LEVAQUIN) IV Stopped (01/06/22 2015)   metronidazole 500 mg (01/07/22 1031)     Principal Problem:   DKA, type 1 (HCC) Active Problems:   Colitis, indeterminate   Parkinson disease (HCC)   CAD S/P percutaneous coronary angioplasty   LOS: 3 days    A & P  Assessment and Plan: * DKA, type 1 (HCC) Resolved. Mild, likely due to colitis. The patient will be placed on Endotool with D5 1/2 NS IV fluid to correct DKA which is primarily due to insulin deficiency with minor hyperglycemia. Insulin pump was turned off. The patient has closed her gap and beta hydroxy butyric acid has declined to the normal range. She has been transitioned to subcutaneous insulin, and was transferred to the floor.  Glucoses have been 40-244 in the last 24 hours. Lantus has been decreased from 12 units to 6 units. Continue to monitor.  CAD S/P percutaneous coronary angioplasty Continue home doses of Plavix, ASA, and metoprolol. Monitor on telemetry. Troponin 35 in ED. No complaints of chest pain.  Parkinson disease (HCC) Continue sinemet as at home.  Colitis, indeterminate Mild inflammation of the colonic mucosa in descending colon and sigmoid colon. With no elevation of WBC, and no fever, findings are concerning for ischemic colitis. The patient will be given IV fluids and IV Levaquin and flagyl. No further diarrhea.  The patient continuees to have pain in the left lower quadrant. Continue IV antibiotics. Advance diet. May need GI consult if not improved by tomorrow.   I have seen and examined this patient myself. I have spent 34 minute in her evaluation and care.  DVT prophylaxis: Lovenox Code Status: Full Code Family Communication: Daughter is at bedside Disposition Plan: Home    Lindsey Newmann, DO [facility] Direct contact: see [email or web address]  7PM-7AM contact night coverage as above 01/07/2022, 5:00 PM  LOS: 1 day

## 2022-01-08 DIAGNOSIS — E1010 Type 1 diabetes mellitus with ketoacidosis without coma: Secondary | ICD-10-CM | POA: Diagnosis not present

## 2022-01-08 DIAGNOSIS — K523 Indeterminate colitis: Secondary | ICD-10-CM | POA: Diagnosis not present

## 2022-01-08 LAB — GLUCOSE, CAPILLARY
Glucose-Capillary: 122 mg/dL — ABNORMAL HIGH (ref 70–99)
Glucose-Capillary: 130 mg/dL — ABNORMAL HIGH (ref 70–99)
Glucose-Capillary: 149 mg/dL — ABNORMAL HIGH (ref 70–99)
Glucose-Capillary: 154 mg/dL — ABNORMAL HIGH (ref 70–99)
Glucose-Capillary: 232 mg/dL — ABNORMAL HIGH (ref 70–99)
Glucose-Capillary: 290 mg/dL — ABNORMAL HIGH (ref 70–99)

## 2022-01-08 LAB — CBC WITH DIFFERENTIAL/PLATELET
Abs Immature Granulocytes: 0.02 10*3/uL (ref 0.00–0.07)
Basophils Absolute: 0 10*3/uL (ref 0.0–0.1)
Basophils Relative: 1 %
Eosinophils Absolute: 0.5 10*3/uL (ref 0.0–0.5)
Eosinophils Relative: 6 %
HCT: 32.6 % — ABNORMAL LOW (ref 36.0–46.0)
Hemoglobin: 10.5 g/dL — ABNORMAL LOW (ref 12.0–15.0)
Immature Granulocytes: 0 %
Lymphocytes Relative: 39 %
Lymphs Abs: 3.1 10*3/uL (ref 0.7–4.0)
MCH: 30.5 pg (ref 26.0–34.0)
MCHC: 32.2 g/dL (ref 30.0–36.0)
MCV: 94.8 fL (ref 80.0–100.0)
Monocytes Absolute: 0.9 10*3/uL (ref 0.1–1.0)
Monocytes Relative: 12 %
Neutro Abs: 3.5 10*3/uL (ref 1.7–7.7)
Neutrophils Relative %: 42 %
Platelets: 183 10*3/uL (ref 150–400)
RBC: 3.44 MIL/uL — ABNORMAL LOW (ref 3.87–5.11)
RDW: 12.5 % (ref 11.5–15.5)
WBC: 8.1 10*3/uL (ref 4.0–10.5)
nRBC: 0 % (ref 0.0–0.2)

## 2022-01-08 LAB — BASIC METABOLIC PANEL
Anion gap: 6 (ref 5–15)
BUN: 11 mg/dL (ref 8–23)
CO2: 26 mmol/L (ref 22–32)
Calcium: 8.7 mg/dL — ABNORMAL LOW (ref 8.9–10.3)
Chloride: 105 mmol/L (ref 98–111)
Creatinine, Ser: 0.8 mg/dL (ref 0.44–1.00)
GFR, Estimated: >60 mL/min (ref >60)
Glucose, Bld: 145 mg/dL — ABNORMAL HIGH (ref 70–99)
Potassium: 3.9 mmol/L (ref 3.5–5.1)
Sodium: 137 mmol/L (ref 135–145)

## 2022-01-08 MED ORDER — SODIUM CHLORIDE 0.9 % IV SOLN: INTRAVENOUS | Status: DC

## 2022-01-08 NOTE — Progress Notes (Signed)
PROGRESS NOTE    Lindsey Padilla  JME:[id number] DOB: Feb 08, 1933 DOA: 01/04/2022 PCP: Mick Sell, MD    Brief Narrative:  88 y.o. female with medical history significant of DMI, Parkinsons, CAD, S/P PCI, Hyperlipidemia, Hypertension, chronic constipation.   The patient presented with complaints of Diarrhea since Tuesday followed by nausea and vomiting on Wenesday. She states that she hasn't been able to keep anything down since Tuesday including her usual medications other than insulin in pump. The diarrhea has not been bloody.    She denies fevers or chills. No hematemsis, coffee ground emesis, hematochezia, or melena.    In the ED the patient was found to be in mild DKA with a beta hydroxybutyric acid of 1.65, and ketones in urine. PH is normal. Glucose was 190.  CT of the abdomen and pelvis demonstrated mild thickening of the descending and sigmoid colon wall suspicious for infectious of inflammatory colitis. Lab work demonstrated a troponin of 35, Creatinine of 0.98. and hemoglobin of 11.7. There is no increased WBC.   DKA resolved and patient transferred to the floor.  Began to endorse abdominal pain.  Abdominal imaging with evidence of colitis.  Unclear etiology.  I will started on IV Levaquin and metronidazole.  Diarrhea and vomiting resolved.  Patient tolerating p.o.  Some mild abdominal soreness endorsed.  Glycemic control improved.   Assessment & Plan:   Principal Problem:   DKA, type 1 (HCC) Active Problems:   Colitis, indeterminate   Parkinson disease (HCC)   CAD S/P percutaneous coronary angioplasty  DKA type I Type 1 diabetes mellitus Mild.  Unclear etiology.  Possibly due to colitis.  DKA now resolved.  Patient currently on basal bolus regimen.  Insulin pump turned off.  Diabetes coordinator following. Plan: Conservative basal bolus regimen to avoid hypoglycemia DM coordinator follow-up  CAD status post PCI Continue dual antiplatelet therapy aspirin and  Plavix Metoprolol DC telemetry  Acute colitis Mild inflammation of colonic mucosa with descending and sigmoid colon involved No leukocytosis, no fevers Possible acute mild ischemic colitis Unable to exclude infectious etiology Plan: Continue Levaquin and Flagyl IV fluids Diet as tolerated Reevaluate in a.m.   DVT prophylaxis: SQ Lovenox Code Status: Full Family Communication: Granddaughter at bedside 8/22 Disposition Plan: Status is: Inpatient Remains inpatient appropriate because: Resolving DKA.  Resolving colitis.  Anticipated date of discharge 8/23   Level of care: Med-Surg  Consultants:  None  Procedures:  None  Antimicrobials: Levaquin Flagyl   Subjective: Seen and examined.  Overall stable.  Endorses mild lower abdominal soreness.  Unable to further characterize.  Objective: Vitals:   01/07/22 1551 01/07/22 1912 01/08/22 0307 01/08/22 0728  BP: (!) 130/55 (!) 147/59 (!) 149/59 (!) 148/68  Pulse: 63 62 64 63  Resp: 16 16 16 16   Temp: 98.3 F (36.8 C) 98.3 F (36.8 C) 98.1 F (36.7 C) 97.9 F (36.6 C)  TempSrc: Oral Oral Oral   SpO2: 99% 98% 98% 96%  Weight:      Height:        Intake/Output Summary (Last 24 hours) at 01/08/2022 1531 Last data filed at 01/08/2022 1429 Gross per 24 hour  Intake 540 ml  Output 1900 ml  Net -1360 ml   Filed Weights   01/04/22 1200  Weight: 64.9 kg    Examination:  General exam: Appears calm and comfortable  Respiratory system: Clear to auscultation. Respiratory effort normal. Cardiovascular system: S1-S2, RRR, no murmurs, no pedal edema Gastrointestinal system: Soft, nondistended, hyperactive bowel  sounds, mild TTP suprapubic region Central nervous system: Alert and oriented. No focal neurological deficits. Extremities: Symmetric 5 x 5 power. Skin: No rashes, lesions or ulcers Psychiatry: Judgement and insight appear normal. Mood & affect appropriate.     Data Reviewed: I have personally reviewed  following labs and imaging studies  CBC: Recent Labs  Lab 01/04/22 1209 01/04/22 1801 01/05/22 0252 01/07/22 0337 01/08/22 0428  WBC 8.2 8.3 8.6 6.8 8.1  NEUTROABS  --  5.0 5.5 2.9 3.5  HGB 11.7* 10.5* 9.7* 10.1* 10.5*  HCT 37.7 32.9* 29.9* 30.7* 32.6*  MCV 96.4 96.2 95.5 93.9 94.8  PLT 177 159 150 167 183   Basic Metabolic Panel: Recent Labs  Lab 01/04/22 1931 01/05/22 0014 01/05/22 0542 01/05/22 0949 01/06/22 0329 01/07/22 0337 01/08/22 0428  NA  --    < > 134* 134* 140 137 137  K  --    < > 3.4* 3.3* 3.7 3.5 3.9  CL  --    < > 104 105 110 105 105  CO2  --    < > 24 23 26 27 26   GLUCOSE  --    < > 136* 149* 45* 156* 145*  BUN  --    < > 13 10 8 8 11   CREATININE  --    < > 0.69 0.74 0.75 0.73 0.80  CALCIUM  --    < > 8.4* 8.4* 8.4* 8.6* 8.7*  MG 1.9  --   --   --   --   --   --   PHOS 2.8  --   --   --   --   --   --    < > = values in this interval not displayed.   GFR: Estimated Creatinine Clearance: 44.9 mL/min (by C-G formula based on SCr of 0.8 mg/dL). Liver Function Tests: Recent Labs  Lab 01/04/22 1209  AST 35  ALT 27  ALKPHOS 45  BILITOT 0.6  PROT 7.1  ALBUMIN 4.2   Recent Labs  Lab 01/04/22 1209  LIPASE 20   No results for input(s): "AMMONIA" in the last 168 hours. Coagulation Profile: No results for input(s): "INR", "PROTIME" in the last 168 hours. Cardiac Enzymes: No results for input(s): "CKTOTAL", "CKMB", "CKMBINDEX", "TROPONINI" in the last 168 hours. BNP (last 3 results) No results for input(s): "PROBNP" in the last 8760 hours. HbA1C: No results for input(s): "HGBA1C" in the last 72 hours. CBG: Recent Labs  Lab 01/07/22 1909 01/07/22 2045 01/08/22 0000 01/08/22 0725 01/08/22 1122  GLUCAP 129* 179* 149* 154* 232*   Lipid Profile: No results for input(s): "CHOL", "HDL", "LDLCALC", "TRIG", "CHOLHDL", "LDLDIRECT" in the last 72 hours. Thyroid Function Tests: No results for input(s): "TSH", "T4TOTAL", "FREET4", "T3FREE",  "THYROIDAB" in the last 72 hours. Anemia Panel: No results for input(s): "VITAMINB12", "FOLATE", "FERRITIN", "TIBC", "IRON", "RETICCTPCT" in the last 72 hours. Sepsis Labs: Recent Labs  Lab 01/04/22 1931  LATICACIDVEN 1.7    Recent Results (from the past 240 hour(s))  MRSA Next Gen by PCR, Nasal     Status: None   Collection Time: 01/05/22  3:18 AM   Specimen: Nasal Mucosa; Nasal Swab  Result Value Ref Range Status   MRSA by PCR Next Gen NOT DETECTED NOT DETECTED Final    Comment: (NOTE) The GeneXpert MRSA Assay (FDA approved for NASAL specimens only), is one component of a comprehensive MRSA colonization surveillance program. It is not intended to diagnose MRSA infection nor to guide  or monitor treatment for MRSA infections. Test performance is not FDA approved in patients less than 12 years old. Performed at [facility], [address] [place], [place] [address]          Radiology Studies: No results found.      Scheduled Meds:  aspirin EC  81 mg Oral Daily   citalopram  10 mg Oral Daily   enoxaparin (LOVENOX) injection  40 mg Subcutaneous Q24H   gabapentin  100 mg Oral QPC breakfast   gabapentin  200 mg Oral QHS   insulin aspart  0-6 Units Subcutaneous TID WC   insulin aspart  3 Units Subcutaneous TID WC   insulin glargine-yfgn  3 Units Subcutaneous Daily   levothyroxine  100 mcg Oral Daily   metoprolol succinate  25 mg Oral Daily   Continuous Infusions:  sodium chloride 75 mL/hr at 01/08/22 0839   levofloxacin (LEVAQUIN) IV Stopped (01/06/22 2015)   metronidazole 500 mg (01/08/22 0928)     LOS: 4 days      Tresa Moore, MD [facility]   If 7PM-7AM, please contact night-coverage  01/08/2022, 3:31 PM

## 2022-01-08 NOTE — Evaluation (Signed)
Physical Therapy Evaluation Patient Details Name: Lindsey Padilla MRN: [id number] DOB: April 30, 1933 Today's Date: 01/08/2022  History of Present Illness  Lindsey Padilla is a 88 y.o. female with medical history significant of DMI, Parkinsons, CAD, S/P PCI, Hyperlipidemia, Hypertension, chronic constipation.  Clinical Impression  Pt did very well with all aspects of PT exam.  She showed good safety awareness, functional balance, easily circumambulate the nurses' station and though she is feeling a little weaker than baseline she moved very well (daughter present and agrees she is not far from baseline) and overall she showed good safety awareness and the ability to return home alone w/o further PT intervention.  No further needs, will sign off.     Recommendations for follow up therapy are one component of a multi-disciplinary discharge planning process, led by the attending physician.  Recommendations may be updated based on patient status, additional functional criteria and insurance authorization.  Follow Up Recommendations No PT follow up      Assistance Recommended at Discharge    Patient can return home with the following       Equipment Recommendations None recommended by PT  Recommendations for Other Services       Functional Status Assessment Patient has not had a recent decline in their functional status     Precautions / Restrictions Precautions Precautions: Fall Restrictions Weight Bearing Restrictions: No      Mobility  Bed Mobility               General bed mobility comments: in recliner on arrival and post session    Transfers Overall transfer level: Modified independent Equipment used: Rolling walker (2 wheels)               General transfer comment: Pt showed good awareness of UE placement, safetly awareness and showed good safety and confidence with all transitions    Ambulation/Gait Ambulation/Gait assistance: Modified independent  (Device/Increase time) Gait Distance (Feet): 200 Feet Assistive device: Rolling walker (2 wheels)         General Gait Details: Pt with consistent and confident gait.  Her O2 remained in the high 90s, HR stayed below 90 bpm, no LOBs or other overt safety issues.  Pt reports feeling close to her baseline.  Stairs            Wheelchair Mobility    Modified Rankin (Stroke Patients Only)       Balance Overall balance assessment: Modified Independent                                           Pertinent Vitals/Pain Pain Assessment Pain Assessment: No/denies pain    Home Living Family/patient expects to be discharged to:: Private residence Living Arrangements: Alone Available Help at Discharge: Family Type of Home: House Home Access: Stairs to enter   Secretary/administrator of Steps: 1   Home Layout: One level Home Equipment: Rollator (4 wheels);Cane - single point;Shower seat;Grab bars - tub/shower;Grab bars - toilet      Prior Function Prior Level of Function : Independent/Modified Independent;Driving             Mobility Comments: drives to grocery store, PRN IADL assist from daughter ADLs Comments: AE for socks 2/2 arthritis / back pain     Hand Dominance        Extremity/Trunk Assessment   Upper Extremity Assessment Upper Extremity  Assessment: Overall WFL for tasks assessed    Lower Extremity Assessment Lower Extremity Assessment: Overall WFL for tasks assessed       Communication   Communication: HOH  Cognition Arousal/Alertness: Awake/alert Behavior During Therapy: WFL for tasks assessed/performed Overall Cognitive Status: Within Functional Limits for tasks assessed                                          General Comments      Exercises     Assessment/Plan    PT Assessment Patient does not need any further PT services  PT Problem List         PT Treatment Interventions      PT Goals  (Current goals can be found in the Care Plan section)  Acute Rehab PT Goals Patient Stated Goal: go home PT Goal Formulation: With patient Time For Goal Achievement: 01/21/22 Potential to Achieve Goals: Good    Frequency       Co-evaluation               AM-PAC PT "6 Clicks" Mobility  Outcome Measure Help needed turning from your back to your side while in a flat bed without using bedrails?: None Help needed moving from lying on your back to sitting on the side of a flat bed without using bedrails?: None Help needed moving to and from a bed to a chair (including a wheelchair)?: None Help needed standing up from a chair using your arms (e.g., wheelchair or bedside chair)?: None Help needed to walk in hospital room?: None Help needed climbing 3-5 steps with a railing? : None 6 Click Score: 24    End of Session Equipment Utilized During Treatment: Gait belt Activity Tolerance: Patient tolerated treatment well Patient left: in chair;with call bell/phone within reach;with family/visitor present   PT Visit Diagnosis: Muscle weakness (generalized) (M62.81)    Time: 5784-6962 PT Time Calculation (min) (ACUTE ONLY): 20 min   Charges:   PT Evaluation $PT Eval Low Complexity: 1 Low          Malachi Pro, DPT 01/08/2022, 2:30 PM

## 2022-01-08 NOTE — Evaluation (Signed)
Occupational Therapy Evaluation Patient Details Name: Lindsey Padilla MRN: [id number] DOB: 11-25-1932 Today's Date: 01/08/2022   History of Present Illness Lindsey Padilla is a 88 y.o. female with medical history significant of DMI, Parkinsons, CAD, S/P PCI, Hyperlipidemia, Hypertension, chronic constipation.   Clinical Impression   Lindsey Padilla was seen for OT evaluation this date. Prior to hospital admission, pt was MOD I using 4WW. Pt lives alone with family available PRN. Pt presents to acute OT demonstrating impaired ADL performance and functional mobility 2/2 decreased activity tolerance and functional strength deficits. Pt currently requires MOD A don B socks in sitting. MOD I + RW for toilet t/f, pericare in sitting, and hand washing standing sink side. SBA + RW for ADL t/f ~100 ft - cues for RW use and rest breaks as pt fatigues. Pt near functional baseline, no acute OT needs identified, will sign off. Upon hospital discharge, anticipate no OT needs.   Recommendations for follow up therapy are one component of a multi-disciplinary discharge planning process, led by the attending physician.  Recommendations may be updated based on patient status, additional functional criteria and insurance authorization.   Follow Up Recommendations  No OT follow up    Assistance Recommended at Discharge Set up Supervision/Assistance  Patient can return home with the following A little help with bathing/dressing/bathroom;Assistance with cooking/housework    Functional Status Assessment  Patient has had a recent decline in their functional status and demonstrates the ability to make significant improvements in function in a reasonable and predictable amount of time.  Equipment Recommendations  None recommended by OT    Recommendations for Other Services       Precautions / Restrictions Precautions Precautions: None Restrictions Weight Bearing Restrictions: No      Mobility Bed  Mobility Overal bed mobility: Modified Independent             General bed mobility comments: HOB elevated    Transfers Overall transfer level: Modified independent Equipment used: Rolling walker (2 wheels)               General transfer comment: rises from bed and low toilet no assist      Balance Overall balance assessment: No apparent balance deficits (not formally assessed)                                         ADL either performed or assessed with clinical judgement   ADL Overall ADL's : Needs assistance/impaired                                       General ADL Comments: MOD A don B socks in sitting. MOD I + RW for toilet t/f, pericare in sitting, and hand washing standing sink side. SBA + RW for ADL t/f ~100 ft - cues for RW use and rest breaks as pt fatigues.      Pertinent Vitals/Pain Pain Assessment Pain Assessment: No/denies pain     Hand Dominance     Extremity/Trunk Assessment Upper Extremity Assessment Upper Extremity Assessment: Overall WFL for tasks assessed   Lower Extremity Assessment Lower Extremity Assessment: Overall WFL for tasks assessed       Communication Communication Communication: HOH   Cognition Arousal/Alertness: Awake/alert Behavior During Therapy: WFL for tasks assessed/performed Overall Cognitive Status: Within  Functional Limits for tasks assessed                                                  Home Living Family/patient expects to be discharged to:: Private residence Living Arrangements: Alone Available Help at Discharge: Family Type of Home: House Home Access: Stairs to enter [employer] of Steps: 1   Home Layout: One level     Bathroom Shower/Tub: Walk-in shower;Tub only   Bathroom Toilet: Handicapped height Bathroom Accessibility: Yes How Accessible: Accessible via walker Home Equipment: Rollator (4 wheels);Cane - single point;Shower  seat;Grab bars - tub/shower;Grab bars - toilet          Prior Functioning/Environment Prior Level of Function : Independent/Modified Independent;Driving             Mobility Comments: drives to grocery store, PRN IADL assist from daughter ADLs Comments: AE for socks 2/2 arthritis / back pain        OT Problem List: Decreased strength;Decreased range of motion;Decreased activity tolerance         OT Goals(Current goals can be found in the care plan section) Acute Rehab OT Goals Patient Stated Goal: to go home OT Goal Formulation: With patient/family Time For Goal Achievement: 01/22/22 Potential to Achieve Goals: Good   AM-PAC OT "6 Clicks" Daily Activity     Outcome Measure Help from another person eating meals?: None Help from another person taking care of personal grooming?: None Help from another person toileting, which includes using toliet, bedpan, or urinal?: None Help from another person bathing (including washing, rinsing, drying)?: None Help from another person to put on and taking off regular upper body clothing?: None Help from another person to put on and taking off regular lower body clothing?: A Lot 6 Click Score: 22   End of Session Equipment Utilized During Treatment: Rolling walker (2 wheels) Nurse Communication: Mobility status  Activity Tolerance: Patient tolerated treatment well Patient left: in chair;with call bell/phone within reach;with family/visitor present  OT Visit Diagnosis: Unsteadiness on feet (R26.81);Muscle weakness (generalized) (M62.81)                Time: 3235-5732 OT Time Calculation (min): 32 min Charges:  OT General Charges $OT Visit: 1 Visit OT Evaluation $OT Eval Moderate Complexity: 1 Mod OT Treatments $Self Care/Home Management : 23-37 mins  Kathie Dike, M.S. OTR/L  01/08/22, 10:59 AM  ascom [phone]

## 2022-01-09 DIAGNOSIS — E1010 Type 1 diabetes mellitus with ketoacidosis without coma: Secondary | ICD-10-CM | POA: Diagnosis not present

## 2022-01-09 DIAGNOSIS — K523 Indeterminate colitis: Secondary | ICD-10-CM | POA: Diagnosis not present

## 2022-01-09 LAB — GLUCOSE, CAPILLARY
Glucose-Capillary: 246 mg/dL — ABNORMAL HIGH (ref 70–99)
Glucose-Capillary: 267 mg/dL — ABNORMAL HIGH (ref 70–99)

## 2022-01-09 MED ORDER — FLUCONAZOLE 100 MG PO TABS
150.0000 mg | ORAL_TABLET | Freq: Once | ORAL | Status: AC
  Administered 2022-01-09: 150 mg via ORAL
  Filled 2022-01-09: qty 1.5

## 2022-01-09 MED ORDER — ALUM & MAG HYDROXIDE-SIMETH 200-200-20 MG/5ML PO SUSP: 15.0000 mL | ORAL | 0 refills | Status: AC | PRN

## 2022-01-09 MED ORDER — ONDANSETRON HCL 4 MG PO TABS: 4.0000 mg | ORAL_TABLET | Freq: Every day | ORAL | 0 refills | Status: AC | PRN

## 2022-01-09 MED ORDER — SIMETHICONE 80 MG PO CHEW
80.0000 mg | CHEWABLE_TABLET | Freq: Four times a day (QID) | ORAL | 0 refills | Status: AC
Start: 2022-01-09 — End: ?

## 2022-01-09 MED ORDER — FLUCONAZOLE 100 MG PO TABS: 100.0000 mg | ORAL_TABLET | Freq: Once | ORAL | 0 refills | Status: AC

## 2022-01-09 MED ORDER — LEVOFLOXACIN 750 MG PO TABS: 750.0000 mg | ORAL_TABLET | ORAL | 0 refills | Status: AC

## 2022-01-09 MED ORDER — METRONIDAZOLE 500 MG PO TABS: 500.0000 mg | ORAL_TABLET | Freq: Three times a day (TID) | ORAL | 0 refills | Status: AC

## 2022-01-09 MED ORDER — SIMETHICONE 80 MG PO CHEW: 80.0000 mg | CHEWABLE_TABLET | Freq: Four times a day (QID) | ORAL | Status: DC

## 2022-01-09 NOTE — Plan of Care (Signed)

## 2022-01-09 NOTE — Progress Notes (Signed)
Patient discharged to home with all belongings via family member. RN reviewed AVS and home medications with patient. Patient and family expressed full understanding and had no questions. VSS at time of discharge. PIVX2 removed with catheter intact.

## 2022-01-09 NOTE — Plan of Care (Signed)
  Problem: Education: Goal: Knowledge of General Education information will improve Description: Including pain rating scale, medication(s)/side effects and non-pharmacologic comfort measures 01/09/2022 0944 by Evelena Peat, RN Outcome: Completed/Met 01/09/2022 0943 by Evelena Peat, RN Outcome: Progressing   Problem: Health Behavior/Discharge Planning: Goal: Ability to manage health-related needs will improve 01/09/2022 0944 by Evelena Peat, RN Outcome: Completed/Met 01/09/2022 0943 by Evelena Peat, RN Outcome: Progressing   Problem: Clinical Measurements: Goal: Ability to maintain clinical measurements within normal limits will improve 01/09/2022 0944 by Evelena Peat, RN Outcome: Completed/Met 01/09/2022 0943 by Evelena Peat, RN Outcome: Progressing Goal: Will remain free from infection 01/09/2022 0944 by Evelena Peat, RN Outcome: Completed/Met 01/09/2022 0943 by Evelena Peat, RN Outcome: Progressing Goal: Diagnostic test results will improve 01/09/2022 0944 by Evelena Peat, RN Outcome: Completed/Met 01/09/2022 0943 by Evelena Peat, RN Outcome: Progressing Goal: Respiratory complications will improve 01/09/2022 0944 by Evelena Peat, RN Outcome: Completed/Met 01/09/2022 0943 by Evelena Peat, RN Outcome: Progressing Goal: Cardiovascular complication will be avoided 01/09/2022 0944 by Evelena Peat, RN Outcome: Completed/Met 01/09/2022 0943 by Evelena Peat, RN Outcome: Progressing

## 2022-01-09 NOTE — Discharge Summary (Signed)
Physician Discharge Summary  Lindsey Padilla GEX:[id number] DOB: Jun 21, 1932 DOA: 01/04/2022  PCP: Mick Sell, MD  Admit date: 01/04/2022 Discharge date: 01/09/2022  Admitted From: Home Disposition:  Home  Recommendations for Outpatient Follow-up:  Follow up with PCP in 1-2 weeks Consider referral to GI  Home Health:No Equipment/Devices:None   Discharge Condition:Stable  CODE STATUS:DNR  Diet recommendation: Carb mod  Brief/Interim Summary: 88 y.o. female with medical history significant of DMI, Parkinsons, CAD, S/P PCI, Hyperlipidemia, Hypertension, chronic constipation.   The patient presented with complaints of Diarrhea since Tuesday followed by nausea and vomiting on Wenesday. She states that she hasn't been able to keep anything down since Tuesday including her usual medications other than insulin in pump. The diarrhea has not been bloody.    She denies fevers or chills. No hematemsis, coffee ground emesis, hematochezia, or melena.    In the ED the patient was found to be in mild DKA with a beta hydroxybutyric acid of 1.65, and ketones in urine. PH is normal. Glucose was 190.  CT of the abdomen and pelvis demonstrated mild thickening of the descending and sigmoid colon wall suspicious for infectious of inflammatory colitis. Lab work demonstrated a troponin of 35, Creatinine of 0.98. and hemoglobin of 11.7. There is no increased WBC.    DKA resolved and patient transferred to the floor.  Began to endorse abdominal pain.  Abdominal imaging with evidence of colitis.  Unclear etiology.  I will started on IV Levaquin and metronidazole.  Diarrhea and vomiting resolved.  Patient tolerating p.o.  Some mild abdominal soreness endorsed.  Glycemic control improved.  On day of discharge patient doing well.  Tolerating p.o. intake with no nausea or vomiting.  Some mild gaseous distention lower abdominal pain resolved with Mylanta.  No fevers over interval.  No white count.  Stable for  discharge home.  Will complete empiric 10-day course of treatment for suspected infectious colitis.  Patient received long-acting Semglee in-house the day.  Can resume home insulin pump by tomorrow 8/24.  Follow-up outpatient PCP.  If symptoms persist consider referral to GI as outpatient.    Discharge Diagnoses:  Principal Problem:   DKA, type 1 (HCC) Active Problems:   Colitis, indeterminate   Parkinson disease (HCC)   CAD S/P percutaneous coronary angioplasty  DKA type I Type 1 diabetes mellitus Mild.  Unclear etiology.  Possibly due to colitis.  DKA now resolved.  Patient currently on basal bolus regimen.  Insulin pump turned off.  Diabetes coordinator following. Plan: Discharge home.  Received Semglee subcutaneous while inpatient on 8/23.  Can resume home insulin pump 8/24  CAD status post PCI Continue dual antiplatelet therapy aspirin and Plavix Metoprolol   Acute colitis Mild inflammation of colonic mucosa with descending and sigmoid colon involved No leukocytosis, no fevers Possible acute mild ischemic colitis Unable to exclude infectious etiology Tolerating p.o. intake without nausea or vomiting on the day of discharge No diarrhea Plan: Discharge home.  Convert Levaquin and Flagyl to p.o.  Complete empiric 10-day course.  Okay for soft diet.  Ensure adequate hydration.  Follow-up outpatient PCP.  Consider outpatient GI evaluation if symptoms persistent.  Discharge Instructions  Discharge Instructions     Diet general   Complete by: As directed    Soft, bland diet.  Plenty of fluid   Increase activity slowly   Complete by: As directed       Allergies as of 01/09/2022       Reactions   Nitrofurantoin  Shortness Of Breath   BS drops, wheezing   Sulfa Antibiotics Swelling   Atorvastatin Other (See Comments)   Knee/thigh pain Other reaction(s): Other (See Comments) Knee/thigh pain Knee/thigh pain   Penicillins Other (See Comments)   Other reaction(s): Other  (See Comments) Patient does not remember   Propoxyphene Other (See Comments)   Other reaction(s): Other (See Comments), Unknown   Tramadol Other (See Comments), Nausea And Vomiting   Other reaction(s): Other (See Comments)   Erythromycin Rash        Medication List     STOP taking these medications    benzonatate 100 MG capsule Commonly known as: TESSALON   brompheniramine-pseudoephedrine-DM 30-2-10 MG/5ML syrup   carbidopa-levodopa 25-100 MG tablet Commonly known as: SINEMET IR   doxycycline 100 MG tablet Commonly known as: VIBRA-TABS   HumaLOG 100 UNIT/ML injection Generic drug: insulin lispro   LACTOBACILLUS PO   PROPYLENE GLYCOL OP   spironolactone 25 MG tablet Commonly known as: ALDACTONE       TAKE these medications    alum & mag hydroxide-simeth 200-200-20 MG/5ML suspension Commonly known as: MAALOX/MYLANTA Take 15 mLs by mouth every 4 (four) hours as needed for indigestion or heartburn.   aspirin 81 MG tablet Take 81 mg by mouth daily.   Biotin Maximum Strength 10 MG Tabs Generic drug: Biotin Take 1,000 mg by mouth daily.   citalopram 10 MG tablet Commonly known as: CELEXA Take 1 tablet by mouth daily.   estradiol 0.1 MG/GM vaginal cream Commonly known as: ESTRACE Place vaginally.   fluticasone 50 MCG/ACT nasal spray Commonly known as: FLONASE Place into both nostrils as needed for allergies or rhinitis.   gabapentin 100 MG capsule Commonly known as: NEURONTIN Take 200 mg by mouth 2 (two) times daily. 1 capsule in AM and 2 capsules in PM   Gemtesa 75 MG Tabs Generic drug: Vibegron Take 75 mg by mouth daily.   hydroxypropyl methylcellulose / hypromellose 2.5 % ophthalmic solution Commonly known as: ISOPTO TEARS / GONIOVISC Apply to eye.   levofloxacin 750 MG tablet Commonly known as: Levaquin Take 1 tablet (750 mg total) by mouth every other day for 2 days. Start taking on: January 10, 2022   levothyroxine 100 MCG  tablet Commonly known as: SYNTHROID Take 100 mcg by mouth every morning. What changed: Another medication with the same name was removed. Continue taking this medication, and follow the directions you see here.   lisinopril 5 MG tablet Commonly known as: ZESTRIL Take 7.5 mg by mouth daily.   metoprolol succinate 25 MG 24 hr tablet Commonly known as: TOPROL-XL Take 25 mg by mouth daily.   metroNIDAZOLE 500 MG tablet Commonly known as: Flagyl Take 1 tablet (500 mg total) by mouth 3 (three) times daily for 3 days. Start taking on: January 10, 2022   montelukast 10 MG tablet Commonly known as: SINGULAIR Take 1 tablet by mouth daily.   multivitamin capsule Take 1 capsule by mouth daily.   NovoLOG 100 UNIT/ML injection Generic drug: insulin aspart Inject 0-45 Units into the skin daily.   ondansetron 4 MG tablet Commonly known as: Zofran Take 1 tablet (4 mg total) by mouth daily as needed for nausea or vomiting.   ONE TOUCH ULTRA TEST test strip Generic drug: glucose blood 6 strips daily. Reported on 09/05/2015   ONE TOUCH ULTRA TEST test strip Generic drug: glucose blood Test up to 8 times daily   pravastatin 20 MG tablet Commonly known as: PRAVACHOL Take 20 mg  by mouth daily.   RA Acetaminophen 650 MG CR tablet Generic drug: acetaminophen Take 2 tablets by mouth 2 (two) times daily. As needed for back pain   simethicone 80 MG chewable tablet Commonly known as: MYLICON Chew 1 tablet (80 mg total) by mouth 4 (four) times daily.   TEARS NATURALE OP Apply 1 drop to eye every 4 (four) hours as needed. For dry eye   vitamin C 100 MG tablet Take 1 tablet by mouth daily.        Allergies  Allergen Reactions   Nitrofurantoin Shortness Of Breath    BS drops, wheezing   Sulfa Antibiotics Swelling   Atorvastatin Other (See Comments)    Knee/thigh pain Other reaction(s): Other (See Comments) Knee/thigh pain Knee/thigh pain   Penicillins Other (See Comments)     Other reaction(s): Other (See Comments) Patient does not remember   Propoxyphene Other (See Comments)    Other reaction(s): Other (See Comments), Unknown   Tramadol Other (See Comments) and Nausea And Vomiting    Other reaction(s): Other (See Comments)   Erythromycin Rash    Consultations: None   Procedures/Studies: CT ABDOMEN PELVIS W CONTRAST  Result Date: 01/04/2022 CLINICAL DATA:  Diarrhea EXAM: CT ABDOMEN AND PELVIS WITH CONTRAST TECHNIQUE: Multidetector CT imaging of the abdomen and pelvis was performed using the standard protocol following bolus administration of intravenous contrast. RADIATION DOSE REDUCTION: This exam was performed according to the departmental dose-optimization program which includes automated exposure control, adjustment of the mA and/or kV according to patient size and/or use of iterative reconstruction technique. CONTRAST:  OMNIPAQUE IOHEXOL 300 MG/ML  SOLN COMPARISON:  CT abdomen/pelvis 11/19/2018 FINDINGS: Lower chest: The lung bases are clear. The imaged heart is unremarkable. Hepatobiliary: There is a punctate calcified granuloma in the right hepatic lobe. The liver is otherwise unremarkable. The gallbladder is surgically absent. Prominence of the intra and extrahepatic bile ducts is likely related to patient age and history of cholecystectomy. Pancreas: Unremarkable. Spleen: Unremarkable. Adrenals/Urinary Tract: The adrenals are unremarkable. The kidneys are unremarkable, with no focal lesion, stone, hydronephrosis, or hydroureter. Is symmetric excretion of contrast into the collecting systems on the delayed images. The bladder is unremarkable. Stomach/Bowel: There is a small hiatal hernia. The stomach is unremarkable. There is mild thickening of the descending and proximal sigmoid colon wall suspicious for mild colitis. There is no other abnormal bowel wall thickening. Vascular/Lymphatic: There is extensive calcified atherosclerotic plaque in the  nonaneurysmal abdominal aorta. The major branch vessels appear patent the main portal and splenic veins are patent. There is no abdominopelvic lymphadenopathy. Reproductive: The uterus is surgically absent. There is no adnexal mass. Other: There is no ascites or free air. There is a small fat containing umbilical hernia. Musculoskeletal: Postsurgical changes reflecting left hip arthroplasty and surgical fixation of a prior right femoral fracture are noted. There is marked scoliotic curvature of the lumbar spine with right lateral listhesis of L2 on L3 and left lateral listhesis of L4 on L5. There is no acute osseous abnormality or suspicious osseous lesion. IMPRESSION: Mild thickening of the descending and sigmoid colon wall suspicious for infectious or inflammatory colitis. Aortic Atherosclerosis (ICD10-I70.0). Electronically Signed   By: Lesia Hausen M.D.   On: 01/04/2022 15:41      Subjective: Seen and examined on the day of discharge.  Daughter at bedside.  Patient stable no distress.  Tolerating p.o. intake with some mild symptoms of the indigestion.  Stable for discharge home at this time.  Discharge  Exam: Vitals:   01/09/22 0433 01/09/22 0728  BP: (!) 160/65 (!) 159/62  Pulse: 67 63  Resp: 20 18  Temp: 98.5 F (36.9 C) (!) 97.5 F (36.4 C)  SpO2: 100% 98%   Vitals:   01/08/22 1545 01/08/22 2004 01/09/22 0433 01/09/22 0728  BP: (!) 138/54 (!) 140/63 (!) 160/65 (!) 159/62  Pulse: 64 60 67 63  Resp: 18 20 20 18   Temp: 97.8 F (36.6 C) 98 F (36.7 C) 98.5 F (36.9 C) (!) 97.5 F (36.4 C)  TempSrc: Oral Oral Oral   SpO2: 99% 100% 100% 98%  Weight:      Height:        General: Pt is alert, awake, not in acute distress Cardiovascular: RRR, S1/S2 +, no rubs, no gallops Respiratory: CTA bilaterally, no wheezing, no rhonchi Abdominal: Soft, NT, ND, bowel sounds + Extremities: no edema, no cyanosis    The results of significant diagnostics from this hospitalization (including  imaging, microbiology, ancillary and laboratory) are listed below for reference.     Microbiology: Recent Results (from the past 240 hour(s))  MRSA Next Gen by PCR, Nasal     Status: None   Collection Time: 01/05/22  3:18 AM   Specimen: Nasal Mucosa; Nasal Swab  Result Value Ref Range Status   MRSA by PCR Next Gen NOT DETECTED NOT DETECTED Final    Comment: (NOTE) The GeneXpert MRSA Assay (FDA approved for NASAL specimens only), is one component of a comprehensive MRSA colonization surveillance program. It is not intended to diagnose MRSA infection nor to guide or monitor treatment for MRSA infections. Test performance is not FDA approved in patients less than 22 years old. Performed at [facility], [address] [place], [place]      Labs: BNP (last 3 results) No results for input(s): "BNP" in the last 8760 hours. Basic Metabolic Panel: Recent Labs  Lab 01/04/22 1931 01/05/22 0014 01/05/22 0542 01/05/22 0949 01/06/22 0329 01/07/22 0337 01/08/22 0428  NA  --    < > 134* 134* 140 137 137  K  --    < > 3.4* 3.3* 3.7 3.5 3.9  CL  --    < > 104 105 110 105 105  CO2  --    < > 24 23 26 27 26   GLUCOSE  --    < > 136* 149* 45* 156* 145*  BUN  --    < > 13 10 8 8 11   CREATININE  --    < > 0.69 0.74 0.75 0.73 0.80  CALCIUM  --    < > 8.4* 8.4* 8.4* 8.6* 8.7*  MG 1.9  --   --   --   --   --   --   PHOS 2.8  --   --   --   --   --   --    < > = values in this interval not displayed.   Liver Function Tests: Recent Labs  Lab 01/04/22 1209  AST 35  ALT 27  ALKPHOS 45  BILITOT 0.6  PROT 7.1  ALBUMIN 4.2   Recent Labs  Lab 01/04/22 1209  LIPASE 20   No results for input(s): "AMMONIA" in the last 168 hours. CBC: Recent Labs  Lab 01/04/22 1209 01/04/22 1801 01/05/22 0252 01/07/22 0337 01/08/22 0428  WBC 8.2 8.3 8.6 6.8 8.1  NEUTROABS  --  5.0 5.5 2.9 3.5  HGB 11.7* 10.5* 9.7* 10.1* 10.5*  HCT  37.7 32.9* 29.9* 30.7* 32.6*  MCV 96.4 96.2  95.5 93.9 94.8  PLT 177 159 150 167 183   Cardiac Enzymes: No results for input(s): "CKTOTAL", "CKMB", "CKMBINDEX", "TROPONINI" in the last 168 hours. BNP: Invalid input(s): "POCBNP" CBG: Recent Labs  Lab 01/08/22 1637 01/08/22 2149 01/08/22 2339 01/09/22 0725 01/09/22 1127  GLUCAP 290* 122* 130* 246* 267*   D-Dimer No results for input(s): "DDIMER" in the last 72 hours. Hgb A1c No results for input(s): "HGBA1C" in the last 72 hours. Lipid Profile No results for input(s): "CHOL", "HDL", "LDLCALC", "TRIG", "CHOLHDL", "LDLDIRECT" in the last 72 hours. Thyroid function studies No results for input(s): "TSH", "T4TOTAL", "T3FREE", "THYROIDAB" in the last 72 hours.  Invalid input(s): "FREET3" Anemia work up No results for input(s): "VITAMINB12", "FOLATE", "FERRITIN", "TIBC", "IRON", "RETICCTPCT" in the last 72 hours. Urinalysis    Component Value Date/Time   COLORURINE YELLOW (A) 01/04/2022 1530   APPEARANCEUR CLEAR (A) 01/04/2022 1530   LABSPEC 1.031 (H) 01/04/2022 1530   PHURINE 5.0 01/04/2022 1530   GLUCOSEU 50 (A) 01/04/2022 1530   HGBUR NEGATIVE 01/04/2022 1530   BILIRUBINUR NEGATIVE 01/04/2022 1530   KETONESUR 20 (A) 01/04/2022 1530   PROTEINUR 30 (A) 01/04/2022 1530   NITRITE NEGATIVE 01/04/2022 1530   LEUKOCYTESUR NEGATIVE 01/04/2022 1530   Sepsis Labs Recent Labs  Lab 01/04/22 1801 01/05/22 0252 01/07/22 0337 01/08/22 0428  WBC 8.3 8.6 6.8 8.1   Microbiology Recent Results (from the past 240 hour(s))  MRSA Next Gen by PCR, Nasal     Status: None   Collection Time: 01/05/22  3:18 AM   Specimen: Nasal Mucosa; Nasal Swab  Result Value Ref Range Status   MRSA by PCR Next Gen NOT DETECTED NOT DETECTED Final    Comment: (NOTE) The GeneXpert MRSA Assay (FDA approved for NASAL specimens only), is one component of a comprehensive MRSA colonization surveillance program. It is not intended to diagnose MRSA infection nor to guide or monitor treatment for MRSA  infections. Test performance is not FDA approved in patients less than 86 years old. Performed at [facility], [address] [place], [place] [address]      Time coordinating discharge: Over 30 minutes  SIGNED:   Tresa MooreSudheer B Brodan Grewell, MD  [facility] 01/09/2022, 12:42 PM Pager   If 7PM-7AM, please contact night-coverage

## 2022-03-26 ENCOUNTER — Other Ambulatory Visit: Payer: Self-pay | Admitting: Physical Medicine and Rehabilitation

## 2022-03-26 DIAGNOSIS — M5416 Radiculopathy, lumbar region: Secondary | ICD-10-CM

## 2022-03-28 ENCOUNTER — Ambulatory Visit
Admission: RE | Admit: 2022-03-28 | Discharge: 2022-03-28 | Disposition: A | Discharge status: Home / Self Care | Payer: Medicare Other | Source: Ambulatory Visit | Attending: Physical Medicine and Rehabilitation | Admitting: Physical Medicine and Rehabilitation

## 2022-03-28 DIAGNOSIS — M5416 Radiculopathy, lumbar region: Secondary | ICD-10-CM | POA: Insufficient documentation

## 2022-09-24 ENCOUNTER — Other Ambulatory Visit
Admission: RE | Admit: 2022-09-24 | Discharge: 2022-09-24 | Disposition: A | Discharge status: Home / Self Care | Payer: Medicare Other | Source: Ambulatory Visit | Attending: Infectious Diseases | Admitting: Infectious Diseases

## 2022-09-24 DIAGNOSIS — R600 Localized edema: Secondary | ICD-10-CM | POA: Diagnosis present

## 2022-09-24 LAB — BRAIN NATRIURETIC PEPTIDE: B Natriuretic Peptide: 377 pg/mL — ABNORMAL HIGH (ref 0.0–100.0)

## 2022-10-07 ENCOUNTER — Other Ambulatory Visit
Admission: RE | Admit: 2022-10-07 | Discharge: 2022-10-07 | Disposition: A | Discharge status: Home / Self Care | Payer: Medicare Other | Source: Ambulatory Visit | Attending: Infectious Diseases | Admitting: Infectious Diseases

## 2022-10-07 DIAGNOSIS — R600 Localized edema: Secondary | ICD-10-CM | POA: Diagnosis not present

## 2022-10-07 DIAGNOSIS — I340 Nonrheumatic mitral (valve) insufficiency: Secondary | ICD-10-CM | POA: Insufficient documentation

## 2022-10-07 DIAGNOSIS — E871 Hypo-osmolality and hyponatremia: Secondary | ICD-10-CM | POA: Diagnosis not present

## 2022-10-07 DIAGNOSIS — I129 Hypertensive chronic kidney disease with stage 1 through stage 4 chronic kidney disease, or unspecified chronic kidney disease: Secondary | ICD-10-CM | POA: Insufficient documentation

## 2022-10-07 DIAGNOSIS — N1831 Chronic kidney disease, stage 3a: Secondary | ICD-10-CM | POA: Insufficient documentation

## 2022-10-07 DIAGNOSIS — I071 Rheumatic tricuspid insufficiency: Secondary | ICD-10-CM | POA: Diagnosis not present

## 2022-10-07 LAB — BRAIN NATRIURETIC PEPTIDE: B Natriuretic Peptide: 236.3 pg/mL — ABNORMAL HIGH (ref 0.0–100.0)

## 2022-11-06 ENCOUNTER — Other Ambulatory Visit
Admission: RE | Admit: 2022-11-06 | Discharge: 2022-11-06 | Disposition: A | Discharge status: Home / Self Care | Payer: Medicare Other | Source: Ambulatory Visit | Attending: Infectious Diseases | Admitting: Infectious Diseases

## 2022-11-06 DIAGNOSIS — I10 Essential (primary) hypertension: Secondary | ICD-10-CM | POA: Insufficient documentation

## 2022-11-06 DIAGNOSIS — N1831 Chronic kidney disease, stage 3a: Secondary | ICD-10-CM | POA: Diagnosis present

## 2022-11-06 DIAGNOSIS — I071 Rheumatic tricuspid insufficiency: Secondary | ICD-10-CM | POA: Insufficient documentation

## 2022-11-06 DIAGNOSIS — E871 Hypo-osmolality and hyponatremia: Secondary | ICD-10-CM | POA: Diagnosis present

## 2022-11-06 DIAGNOSIS — I340 Nonrheumatic mitral (valve) insufficiency: Secondary | ICD-10-CM | POA: Diagnosis present

## 2022-11-06 DIAGNOSIS — R600 Localized edema: Secondary | ICD-10-CM | POA: Insufficient documentation

## 2022-11-06 LAB — BRAIN NATRIURETIC PEPTIDE: B Natriuretic Peptide: 478.8 pg/mL — ABNORMAL HIGH (ref 0.0–100.0)

## 2022-11-11 ENCOUNTER — Other Ambulatory Visit: Payer: Self-pay | Admitting: Nephrology

## 2022-11-11 DIAGNOSIS — E871 Hypo-osmolality and hyponatremia: Secondary | ICD-10-CM

## 2022-11-11 DIAGNOSIS — N1832 Chronic kidney disease, stage 3b: Secondary | ICD-10-CM

## 2022-11-18 ENCOUNTER — Ambulatory Visit
Admission: RE | Admit: 2022-11-18 | Discharge: 2022-11-18 | Disposition: A | Discharge status: Home / Self Care | Payer: Medicare Other | Source: Ambulatory Visit | Attending: Nephrology | Admitting: Nephrology

## 2022-11-18 DIAGNOSIS — E871 Hypo-osmolality and hyponatremia: Secondary | ICD-10-CM | POA: Insufficient documentation

## 2022-11-18 DIAGNOSIS — N1832 Chronic kidney disease, stage 3b: Secondary | ICD-10-CM | POA: Diagnosis present
